# Patient Record
Sex: Female | Born: 1995 | Race: Black or African American | Hispanic: No | Smoking: Current every day smoker
Health system: Southern US, Community
[De-identification: ages and names within clinical notes are randomized; demographics above are authoritative.]

## PROBLEM LIST (undated history)

## (undated) ENCOUNTER — Inpatient Hospital Stay (HOSPITAL_COMMUNITY): Payer: Self-pay

## (undated) DIAGNOSIS — O1490 Unspecified pre-eclampsia, unspecified trimester: Secondary | ICD-10-CM

## (undated) DIAGNOSIS — G510 Bell's palsy: Secondary | ICD-10-CM

## (undated) DIAGNOSIS — B009 Herpesviral infection, unspecified: Secondary | ICD-10-CM

## (undated) HISTORY — DX: Unspecified pre-eclampsia, unspecified trimester: O14.90

## (undated) HISTORY — PX: COSMETIC SURGERY: SHX468

## (undated) HISTORY — PX: INDUCED ABORTION: SHX677

## (undated) HISTORY — DX: Herpesviral infection, unspecified: B00.9

---
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
## 2013-04-02 ENCOUNTER — Encounter (HOSPITAL_COMMUNITY): Payer: Self-pay | Admitting: *Deleted

## 2013-04-02 ENCOUNTER — Emergency Department (HOSPITAL_COMMUNITY)
Admission: EM | Admit: 2013-04-02 | Discharge: 2013-04-02 | Disposition: A | Discharge status: Home / Self Care | Payer: Medicaid Other | Attending: Emergency Medicine | Admitting: Emergency Medicine

## 2013-04-02 ENCOUNTER — Emergency Department (HOSPITAL_COMMUNITY): Payer: Medicaid Other

## 2013-04-02 DIAGNOSIS — O99330 Smoking (tobacco) complicating pregnancy, unspecified trimester: Secondary | ICD-10-CM | POA: Insufficient documentation

## 2013-04-02 DIAGNOSIS — O209 Hemorrhage in early pregnancy, unspecified: Secondary | ICD-10-CM | POA: Insufficient documentation

## 2013-04-02 DIAGNOSIS — O418X10 Other specified disorders of amniotic fluid and membranes, first trimester, not applicable or unspecified: Secondary | ICD-10-CM

## 2013-04-02 DIAGNOSIS — O43899 Other placental disorders, unspecified trimester: Secondary | ICD-10-CM | POA: Insufficient documentation

## 2013-04-02 DIAGNOSIS — R109 Unspecified abdominal pain: Secondary | ICD-10-CM | POA: Insufficient documentation

## 2013-04-02 DIAGNOSIS — R110 Nausea: Secondary | ICD-10-CM | POA: Insufficient documentation

## 2013-04-02 DIAGNOSIS — O9989 Other specified diseases and conditions complicating pregnancy, childbirth and the puerperium: Secondary | ICD-10-CM | POA: Insufficient documentation

## 2013-04-02 DIAGNOSIS — R350 Frequency of micturition: Secondary | ICD-10-CM | POA: Insufficient documentation

## 2013-04-02 DIAGNOSIS — O368990 Maternal care for other specified fetal problems, unspecified trimester, not applicable or unspecified: Secondary | ICD-10-CM | POA: Insufficient documentation

## 2013-04-02 LAB — CBC WITH DIFFERENTIAL/PLATELET
Basophils Absolute: 0 10*3/uL (ref 0.0–0.1)
Basophils Relative: 1 % (ref 0–1)
Eosinophils Absolute: 0 10*3/uL (ref 0.0–0.7)
Hemoglobin: 13.5 g/dL (ref 12.0–15.0)
MCH: 28.5 pg (ref 26.0–34.0)
MCHC: 34 g/dL (ref 30.0–36.0)
Monocytes Relative: 8 % (ref 3–12)
Neutrophils Relative %: 59 % (ref 43–77)
RDW: 13 % (ref 11.5–15.5)

## 2013-04-02 LAB — WET PREP, GENITAL
Clue Cells Wet Prep HPF POC: NONE SEEN
Trich, Wet Prep: NONE SEEN
WBC, Wet Prep HPF POC: NONE SEEN
Yeast Wet Prep HPF POC: NONE SEEN

## 2013-04-02 LAB — URINALYSIS, ROUTINE W REFLEX MICROSCOPIC
Bilirubin Urine: NEGATIVE
Glucose, UA: NEGATIVE mg/dL
Ketones, ur: 40 mg/dL — AB
Nitrite: NEGATIVE
Specific Gravity, Urine: >1.03 — ABNORMAL HIGH (ref 1.005–1.030)
pH: 6 (ref 5.0–8.0)

## 2013-04-02 LAB — HCG, QUANTITATIVE, PREGNANCY: hCG, Beta Chain, Quant, S: 9100 m[IU]/mL — ABNORMAL HIGH (ref <5)

## 2013-04-02 LAB — URINE MICROSCOPIC-ADD ON

## 2013-04-02 MED ORDER — ONDANSETRON HCL 4 MG PO TABS: 4.0000 mg | ORAL_TABLET | Freq: Four times a day (QID) | ORAL | Status: DC

## 2013-04-02 NOTE — ED Notes (Signed)
Vaginal bleeding, 5-[redacted] weeks pregnant.  Bleeding started  11 am.  "a little " cramping. With intermittent "sharp pains"

## 2013-04-02 NOTE — ED Provider Notes (Signed)
History     CSN: [id number]  Arrival date & time 04/02/13  1736   First MD Initiated Contact with Patient 04/02/13 1849      Chief Complaint  Patient presents with  . Vaginal Bleeding    (Consider location/radiation/quality/duration/timing/severity/associated sxs/prior treatment) Patient is a 17 y.o. female presenting with vaginal bleeding. The history is provided by the patient.  Vaginal Bleeding Quality:  Bright red and lighter than menses (sees blood on tissue after using toilet) Severity:  Moderate Onset quality:  Gradual Duration:  8 hours Timing:  Intermittent Progression:  Worsening Chronicity:  New Possible pregnancy: yes   Relieved by:  Nothing Associated symptoms: nausea   Associated symptoms: no back pain, no dysuria, no fatigue and no fever  Abdominal pain: cramping.    [place] is a 17 y.o. female G2 P 0, 0, 1, 0 who presents to the ED with vaginal bleeding. She states that she is maybe 5 to [redacted] weeks gestation. The bleeding started today approximately 11 am. She is having some lower abdominal cramping. LMP 02/11/2013. Gestational age [redacted] weeks 2 days by that LMP. [facility]. No taking anything for the cramping. Last pap smear one year ago and was normal. Hx of GC about a year ago. Current sex partner x 4 years. Never been on birth control.  History reviewed. No pertinent past medical history.  Past Surgical History  Procedure Laterality Date  . Induced abortion      History reviewed. No pertinent family history.  History  Substance Use Topics  . Smoking status: Current Every Day Smoker    Types: Cigarettes  . Smokeless tobacco: Not on file  . Alcohol Use: Yes     Comment: sometimes    OB History   Grav Para Term Preterm Abortions TAB SAB Ect Mult Living                  Review of Systems  Constitutional: Negative for fever, chills, appetite change and fatigue.  HENT: Negative for congestion,  sneezing and neck pain.   Eyes: Negative for pain and visual disturbance.  Respiratory: Negative for cough, chest tightness, shortness of breath and wheezing.   Gastrointestinal: Positive for nausea. Negative for vomiting. Abdominal pain: cramping.  Genitourinary: Positive for frequency and vaginal bleeding. Negative for dysuria and menstrual problem.  Musculoskeletal: Negative for back pain.  Skin: Negative for rash.  Neurological: Negative for light-headedness and headaches.  Psychiatric/Behavioral: The patient is not nervous/anxious.     Allergies  Review of patient's allergies indicates no known allergies.  Home Medications  No current outpatient prescriptions on file.  BP 104/88  Pulse 72  Temp(Src) 97.6 F (36.4 C) (Oral)  Resp 16  Ht 5\' 5"  (1.651 m)  Wt 115 lb (52.164 kg)  BMI 19.14 kg/m2  SpO2 98%  LMP 02/02/2013  Physical Exam  Nursing note and vitals reviewed. Constitutional: She is oriented to person, place, and time. She appears well-developed and well-nourished. No distress.  HENT:  Head: Normocephalic and atraumatic.  Eyes: EOM are normal.  Neck: Neck supple.  Cardiovascular: Normal rate.   Pulmonary/Chest: Effort normal.  Abdominal: Soft. There is no tenderness.  Unable to reproduce the cramping pain the patient has had.  Genitourinary:  External genitalia without lesions. Scant dark blood vaginal vault. No CMT, no adnexal tenderness. Uterus slightly enlarged.  Musculoskeletal: Normal range of motion.  Neurological: She is alert and oriented to person, place, and time. No  cranial nerve deficit.  Skin: Skin is warm and dry.  Psychiatric: She has a normal mood and affect. Her behavior is normal. Judgment and thought content normal.    ED Course  Procedures (including critical care time) Results for orders placed during the hospital encounter of 04/02/13 (from the past 24 hour(s))  URINALYSIS, ROUTINE W REFLEX MICROSCOPIC     Status: Abnormal    Collection Time    04/02/13  6:55 PM      Result Value Range   Color, Urine YELLOW  YELLOW   APPearance CLEAR  CLEAR   Specific Gravity, Urine >1.030 (*) 1.005 - 1.030   pH 6.0  5.0 - 8.0   Glucose, UA NEGATIVE  NEGATIVE mg/dL   Hgb urine dipstick LARGE (*) NEGATIVE   Bilirubin Urine NEGATIVE  NEGATIVE   Ketones, ur 40 (*) NEGATIVE mg/dL   Protein, ur TRACE (*) NEGATIVE mg/dL   Urobilinogen, UA 0.2  0.0 - 1.0 mg/dL   Nitrite NEGATIVE  NEGATIVE   Leukocytes, UA NEGATIVE  NEGATIVE  URINE MICROSCOPIC-ADD ON     Status: Abnormal   Collection Time    04/02/13  6:55 PM      Result Value Range   Squamous Epithelial / LPF FEW (*) RARE   WBC, UA 0-2  <3 WBC/hpf   RBC / HPF 11-20  <3 RBC/hpf  POCT PREGNANCY, URINE     Status: Abnormal   Collection Time    04/02/13  6:57 PM      Result Value Range   Preg Test, Ur POSITIVE (*) NEGATIVE  HCG, QUANTITATIVE, PREGNANCY     Status: Abnormal   Collection Time    04/02/13  6:59 PM      Result Value Range   hCG, Beta Chain, Quant, S 9100 (*) <5 mIU/mL  CBC WITH DIFFERENTIAL     Status: None   Collection Time    04/02/13  6:59 PM      Result Value Range   WBC 5.9  4.0 - 10.5 K/uL   RBC 4.74  3.87 - 5.11 MIL/uL   Hemoglobin 13.5  12.0 - 15.0 g/dL   HCT 96.0  45.4 - 09.8 %   MCV 83.8  78.0 - 100.0 fL   MCH 28.5  26.0 - 34.0 pg   MCHC 34.0  30.0 - 36.0 g/dL   RDW 11.9  14.7 - 82.9 %   Platelets 185  150 - 400 K/uL   Neutrophils Relative % 59  43 - 77 %   Neutro Abs 3.4  1.7 - 7.7 K/uL   Lymphocytes Relative 32  12 - 46 %   Lymphs Abs 1.9  0.7 - 4.0 K/uL   Monocytes Relative 8  3 - 12 %   Monocytes Absolute 0.5  0.1 - 1.0 K/uL   Eosinophils Relative 1  0 - 5 %   Eosinophils Absolute 0.0  0.0 - 0.7 K/uL   Basophils Relative 1  0 - 1 %   Basophils Absolute 0.0  0.0 - 0.1 K/uL  WET PREP, GENITAL     Status: None   Collection Time    04/02/13  7:31 PM      Result Value Range   Yeast Wet Prep HPF POC NONE SEEN  NONE SEEN   Trich, Wet  Prep NONE SEEN  NONE SEEN   Clue Cells Wet Prep HPF POC NONE SEEN  NONE SEEN   WBC, Wet Prep HPF POC NONE SEEN  NONE SEEN  Assessment: 17 y.o. female with vaginal bleeding and cramping in early pregnancy.  Diff Dx: Early IUP   SAB   Ectopic pregnancy  Plan:  Will obtain Ultrasound to assess pregnancy  US Ob Comp Less 14 Wks  04/02/2013   *RADIOLOGY REPORT*  Clinical Data: bleeding, pain,bleeding and pain; ;  OBSTETRIC <14 WK [place] AND TRANSVAGINAL OB US  Technique: Both transabdominal and transvaginal ultrasound examinations were performed for complete evaluation of the gestation as well as the maternal uterus, adnexal regions, and pelvic cul-de-sac.  Comparison: None.  Findings: There is a single intrauterine gestation.  Based on crown- rump length, estimated gestational age is 6 weeks 0-day.  While visually there appears to be a cardiac movement, a heart rate could not be obtained.  Recommend follow-up.  Small to moderate subchorionic hemorrhage.  Ovaries are symmetric in size.  Right corpus luteal cyst.  No free fluid.  IMPRESSION: 6-week-0-day intrauterine pregnancy.  Fetal heart rate not measurable at this time.  Recommend follow-up.  Small to moderate subchorionic hemorrhage.   Original Report Authenticated By: Charlett Nose, M.D.   US Ob Transvaginal  04/02/2013   *RADIOLOGY REPORT*  Clinical Data: bleeding, pain,bleeding and pain; ;  OBSTETRIC <14 WK [place] AND TRANSVAGINAL OB US  Technique: Both transabdominal and transvaginal ultrasound examinations were performed for complete evaluation of the gestation as well as the maternal uterus, adnexal regions, and pelvic cul-de-sac.  Comparison: None.  Findings: There is a single intrauterine gestation.  Based on crown- rump length, estimated gestational age is 6 weeks 0-day.  While visually there appears to be a cardiac movement, a heart rate could not be obtained.  Recommend follow-up.  Small to moderate subchorionic hemorrhage.  Ovaries are symmetric  in size.  Right corpus luteal cyst.  No free fluid.  IMPRESSION: 6-week-0-day intrauterine pregnancy.  Fetal heart rate not measurable at this time.  Recommend follow-up.  Small to moderate subchorionic hemorrhage.   Original Report Authenticated By: Charlett Nose, M.D.     MDM  17 y.o. female with threatened AB @ [redacted] weeks gestation. I discussed with the patient that the pregnancy measures 6 weeks by ultrasound but no heart beat is identified. I discussed signs and symptoms of miscarriage and need for follow up with OB. She voices understanding.  Will give Zofran for nausea.   Medication List    TAKE these medications       ondansetron 4 MG tablet  Commonly known as:  ZOFRAN  Take 1 tablet (4 mg total) by mouth every 6 (six) hours.              [place], [place] 04/02/13 2337

## 2013-04-05 ENCOUNTER — Other Ambulatory Visit: Payer: Self-pay | Admitting: Obstetrics & Gynecology

## 2013-04-05 ENCOUNTER — Ambulatory Visit (INDEPENDENT_AMBULATORY_CARE_PROVIDER_SITE_OTHER): Payer: Medicaid Other

## 2013-04-05 DIAGNOSIS — O021 Missed abortion: Secondary | ICD-10-CM

## 2013-04-05 DIAGNOSIS — O26849 Uterine size-date discrepancy, unspecified trimester: Secondary | ICD-10-CM

## 2013-04-05 DIAGNOSIS — O200 Threatened abortion: Secondary | ICD-10-CM

## 2013-04-05 LAB — GC/CHLAMYDIA PROBE AMP: GC Probe RNA: NEGATIVE

## 2013-04-05 NOTE — Progress Notes (Signed)
U/S-single IUP with +FCA noted, CRL c/w 6+1wks EDD 11/28/13, 12+mm sub-chorionic hemm noted adjacent to GS, cx long and closed, bilateral adnexa wnl

## 2013-04-07 NOTE — ED Provider Notes (Signed)
Medical screening examination/treatment/procedure(s) were performed by non-physician practitioner and as supervising physician I was immediately available for consultation/collaboration.   Shelda Jakes, MD 04/07/13 [phone]

## 2013-04-14 ENCOUNTER — Other Ambulatory Visit: Payer: Self-pay

## 2013-04-14 LAB — US OB COMP LESS 14 WKS

## 2013-04-20 ENCOUNTER — Encounter: Payer: Self-pay | Admitting: Women's Health

## 2013-04-20 ENCOUNTER — Ambulatory Visit (INDEPENDENT_AMBULATORY_CARE_PROVIDER_SITE_OTHER): Payer: Medicaid Other | Admitting: Women's Health

## 2013-04-20 VITALS — BP 118/64 | Wt 116.0 lb

## 2013-04-20 DIAGNOSIS — Z1389 Encounter for screening for other disorder: Secondary | ICD-10-CM

## 2013-04-20 DIAGNOSIS — Z3481 Encounter for supervision of other normal pregnancy, first trimester: Secondary | ICD-10-CM

## 2013-04-20 DIAGNOSIS — Z3480 Encounter for supervision of other normal pregnancy, unspecified trimester: Secondary | ICD-10-CM | POA: Insufficient documentation

## 2013-04-20 DIAGNOSIS — O09299 Supervision of pregnancy with other poor reproductive or obstetric history, unspecified trimester: Secondary | ICD-10-CM

## 2013-04-20 DIAGNOSIS — Z331 Pregnant state, incidental: Secondary | ICD-10-CM

## 2013-04-20 LAB — POCT URINALYSIS DIPSTICK
Glucose, UA: NEGATIVE
Ketones, UA: NEGATIVE
Leukocytes, UA: NEGATIVE
Nitrite, UA: NEGATIVE

## 2013-04-20 LAB — CBC
Hemoglobin: 11.8 g/dL — ABNORMAL LOW (ref 12.0–15.0)
MCV: 82.5 fL (ref 78.0–100.0)
Platelets: 194 10*3/uL (ref 150–400)
RBC: 4.4 MIL/uL (ref 3.87–5.11)
WBC: 4.9 10*3/uL (ref 4.0–10.5)

## 2013-04-20 NOTE — Patient Instructions (Addendum)
Pregnancy - First Trimester  During sexual intercourse, millions of sperm go into the vagina. Only 1 sperm will penetrate and fertilize the female egg while it is in the Fallopian tube. One week later, the fertilized egg implants into the wall of the uterus. An embryo begins to develop into a baby. At 6 to 8 weeks, the eyes and face are formed and the heartbeat can be seen on ultrasound. At the end of 12 weeks (first trimester), all the baby's organs are formed. Now that you are pregnant, you will want to do everything you can to have a healthy baby. Two of the most important things are to get good prenatal care and follow your caregiver's instructions. Prenatal care is all the medical care you receive before the baby's birth. It is given to prevent, find, and treat problems during the pregnancy and childbirth.  PRENATAL EXAMS  · During prenatal visits, your weight, blood pressure, and urine are checked. This is done to make sure you are healthy and progressing normally during the pregnancy.  · A pregnant woman should gain 25 to 35 pounds during the pregnancy. However, if you are overweight or underweight, your caregiver will advise you regarding your weight.  · Your caregiver will ask and answer questions for you.  · Blood work, cervical cultures, other necessary tests, and a Pap test are done during your prenatal exams. These tests are done to check on your health and the probable health of your baby. Tests are strongly recommended and done for HIV with your permission. This is the virus that causes AIDS. These tests are done because medicines can be given to help prevent your baby from being born with this infection should you have been infected without knowing it. Blood work is also used to find out your blood type, previous infections, and follow your blood levels (hemoglobin).  · Low hemoglobin (anemia) is common during pregnancy. Iron and vitamins are given to help prevent this. Later in the pregnancy, blood  tests for diabetes will be done along with any other tests if any problems develop.  · You may need other tests to make sure you and the baby are doing well.  CHANGES DURING THE FIRST TRIMESTER   Your body goes through many changes during pregnancy. They vary from person to person. Talk to your caregiver about changes you notice and are concerned about. Changes can include:  · Your menstrual period stops.  · The egg and sperm carry the genes that determine what you look like. Genes from you and your partner are forming a baby. The female genes determine whether the baby is a boy or a girl.  · Your body increases in girth and you may feel bloated.  · Feeling sick to your stomach (nauseous) and throwing up (vomiting). If the vomiting is uncontrollable, call your caregiver.  · Your breasts will begin to enlarge and become tender.  · Your nipples may stick out more and become darker.  · The need to urinate more. Painful urination may mean you have a bladder infection.  · Tiring easily.  · Loss of appetite.  · Cravings for certain kinds of food.  · At first, you may gain or lose a couple of pounds.  · You may have changes in your emotions from day to day (excited to be pregnant or concerned something may go wrong with the pregnancy and baby).  · You may have more vivid and strange dreams.  HOME CARE INSTRUCTIONS   ·   It is very important to avoid all smoking, alcohol and non-prescribed drugs during your pregnancy. These affect the formation and growth of the baby. Avoid chemicals while pregnant to ensure the delivery of a healthy infant.  · Start your prenatal visits by the 12th week of pregnancy. They are usually scheduled monthly at first, then more often in the last 2 months before delivery. Keep your caregiver's appointments. Follow your caregiver's instructions regarding medicine use, blood and lab tests, exercise, and diet.  · During pregnancy, you are providing food for you and your baby. Eat regular, well-balanced  meals. Choose foods such as meat, fish, milk and other low fat dairy products, vegetables, fruits, and whole-grain breads and cereals. Your caregiver will tell you of the ideal weight gain.  · You can help morning sickness by keeping soda crackers at the bedside. Eat a couple before arising in the morning. You may want to use the crackers without salt on them.  · Eating 4 to 5 small meals rather than 3 large meals a day also may help the nausea and vomiting.  · Drinking liquids between meals instead of during meals also seems to help nausea and vomiting.  · A physical sexual relationship may be continued throughout pregnancy if there are no other problems. Problems may be early (premature) leaking of amniotic fluid from the membranes, vaginal bleeding, or belly (abdominal) pain.  · Exercise regularly if there are no restrictions. Check with your caregiver or physical therapist if you are unsure of the safety of some of your exercises. Greater weight gain will occur in the last 2 trimesters of pregnancy. Exercising will help:  · Control your weight.  · Keep you in shape.  · Prepare you for labor and delivery.  · Help you lose your pregnancy weight after you deliver your baby.  · Wear a good support or jogging bra for breast tenderness during pregnancy. This may help if worn during sleep too.  · Ask when prenatal classes are available. Begin classes when they are offered.  · Do not use hot tubs, steam rooms, or saunas.  · Wear your seat belt when driving. This protects you and your baby if you are in an accident.  · Avoid raw meat, uncooked cheese, cat litter boxes, and soil used by cats throughout the pregnancy. These carry germs that can cause birth defects in the baby.  · The first trimester is a good time to visit your dentist for your dental health. Getting your teeth cleaned is okay. Use a softer toothbrush and brush gently during pregnancy.  · Ask for help if you have financial, counseling, or nutritional needs  during pregnancy. Your caregiver will be able to offer counseling for these needs as well as refer you for other special needs.  · Do not take any medicines or herbs unless told by your caregiver.  · Inform your caregiver if there is any mental or physical domestic violence.  · Make a list of emergency phone numbers of family, friends, hospital, and police and fire departments.  · Write down your questions. Take them to your prenatal visit.  · Do not douche.  · Do not cross your legs.  · If you have to stand for long periods of time, rotate you feet or take small steps in a circle.  · You may have more vaginal secretions that may require a sanitary pad. Do not use tampons or scented sanitary pads.  MEDICINES AND DRUG USE IN PREGNANCY  ·   Take prenatal vitamins as directed. The vitamin should contain 1 milligram of folic acid. Keep all vitamins out of reach of children. Only a couple vitamins or tablets containing iron may be fatal to a baby or young child when ingested.  · Avoid use of all medicines, including herbs, over-the-counter medicines, not prescribed or suggested by your caregiver. Only take over-the-counter or prescription medicines for pain, discomfort, or fever as directed by your caregiver. Do not use aspirin, ibuprofen, or naproxen unless directed by your caregiver.  · Let your caregiver also know about herbs you may be using.  · Alcohol is related to a number of birth defects. This includes fetal alcohol syndrome. All alcohol, in any form, should be avoided completely. Smoking will cause low birth rate and premature babies.  · Street or illegal drugs are very harmful to the baby. They are absolutely forbidden. A baby born to an addicted mother will be addicted at birth. The baby will go through the same withdrawal an adult does.  · Let your caregiver know about any medicines that you have to take and for what reason you take them.  SEEK MEDICAL CARE IF:   You have any concerns or worries during your  pregnancy. It is better to call with your questions if you feel they cannot wait, rather than worry about them.  SEEK IMMEDIATE MEDICAL CARE IF:   · An unexplained oral temperature above 102° F (38.9° C) develops, or as your caregiver suggests.  · You have leaking of fluid from the vagina (birth canal). If leaking membranes are suspected, take your temperature and inform your caregiver of this when you call.  · There is vaginal spotting or bleeding. Notify your caregiver of the amount and how many pads are used.  · You develop a bad smelling vaginal discharge with a change in the color.  · You continue to feel sick to your stomach (nauseated) and have no relief from remedies suggested. You vomit blood or coffee ground-like materials.  · You lose more than 2 pounds of weight in 1 week.  · You gain more than 2 pounds of weight in 1 week and you notice swelling of your face, hands, feet, or legs.  · You gain 5 pounds or more in 1 week (even if you do not have swelling of your hands, face, legs, or feet).  · You get exposed to German measles and have never had them.  · You are exposed to fifth disease or chickenpox.  · You develop belly (abdominal) pain. Round ligament discomfort is a common non-cancerous (benign) cause of abdominal pain in pregnancy. Your caregiver still must evaluate this.  · You develop headache, fever, diarrhea, pain with urination, or shortness of breath.  · You fall or are in a car accident or have any kind of trauma.  · There is mental or physical violence in your home.  Document Released: 10/15/2001 Document Revised: 07/15/2012 Document Reviewed: 04/18/2009  [employer]® Patient Information ©2014 [employer].

## 2013-04-20 NOTE — Progress Notes (Signed)
  Subjective:    Virginia Mills is a 17 y.o. G2P0010 African American female at [redacted]w[redacted]d by 6wk u/s, being seen today for her first obstetrical visit.  Her obstetrical history is significant for teen pregnancy and h/o EAB.  Pregnancy history fully reviewed.  Patient reports dark brown, sometimes red, spotting w/ wiping for few weeks. Has been having sexual intercourse. 6wk u/s showed 12mm SCH adjacent to GS.  Denies nausea, vomiting, urinary frequency, hesitancy, dysuria, or urgency.   Filed Vitals:   04/20/13 1118  BP: 118/64  Weight: 116 lb (52.617 kg)    HISTORY: OB History   Grav Para Term Preterm Abortions TAB SAB Ect Mult Living   2    1 1          # Outc Date GA Lbr Len/2nd Wgt Sex Del Anes PTL Lv   1 TAB 2009           2 CUR              Past Medical History  Diagnosis Date  . Medical history non-contributory    Past Surgical History  Procedure Laterality Date  . Induced abortion     History reviewed. No pertinent family history.  System:     Skin: normal coloration and turgor, no rashes    Neurologic: oriented, normal mood   Extremities: normal strength, tone, and muscle mass   HEENT PERRLA   Mouth/Teeth mucous membranes moist   Cardiovascular: regular rate and rhythm   Respiratory:  appears well, vitals normal, no respiratory distress, acyanotic, normal RR   Abdomen: soft, non-tender    Thin prep pap smear not indicated d/t age  FHR 160 via informal transabdominal u/s   Assessment:    Pregnancy: G2P0010 There are no active problems to display for this patient.     [redacted]w[redacted]d G2P0010 New OB visit [facility] w/ VB   Plan:     Initial labs drawn Continue prenatal vitamins Problem list reviewed and updated Reviewed n/v relief measures and warning s/s to report Reviewed recommended weight gain based on pre-gravid BMI Encouraged well-balanced diet Pelvic rest until at least 1wk w/o bleeding Genetic Screening discussed Integrated Screen: requested Cystic  fibrosis screening discussed declined Ultrasound discussed; fetal survey: requested Follow up in 4 weeks for 1st NT/IT and visit [facility] completed and faxed  [name] 04/20/2013 11:50 AM

## 2013-04-20 NOTE — Progress Notes (Signed)
Spotting off and on. New OB packet given. Consents signed.

## 2013-04-21 LAB — ANTIBODY SCREEN: Antibody Screen: NEGATIVE

## 2013-04-21 LAB — URINALYSIS
Leukocytes, UA: NEGATIVE
Nitrite: NEGATIVE
Specific Gravity, Urine: 1.024 (ref 1.005–1.030)
Urobilinogen, UA: 0.2 mg/dL (ref 0.0–1.0)
pH: 7.5 (ref 5.0–8.0)

## 2013-04-21 LAB — DRUG SCREEN, URINE, NO CONFIRMATION
Amphetamine Screen, Ur: NEGATIVE
Benzodiazepines.: NEGATIVE
Methadone: NEGATIVE
Phencyclidine (PCP): NEGATIVE
Propoxyphene: NEGATIVE

## 2013-04-21 LAB — GC/CHLAMYDIA PROBE AMP: GC Probe RNA: NEGATIVE

## 2013-04-21 LAB — OXYCODONE SCREEN, UA, RFLX CONFIRM: Oxycodone Screen, Ur: NEGATIVE ng/mL

## 2013-04-21 LAB — HIV ANTIBODY (ROUTINE TESTING W REFLEX): HIV: NONREACTIVE

## 2013-04-22 LAB — VARICELLA ZOSTER ANTIBODY, IGG: Varicella IgG: 205.8 Index — ABNORMAL HIGH (ref <135.00)

## 2013-04-22 LAB — URINE CULTURE: Colony Count: >=100000

## 2013-04-24 ENCOUNTER — Encounter: Payer: Self-pay | Admitting: Women's Health

## 2013-04-24 DIAGNOSIS — F1290 Cannabis use, unspecified, uncomplicated: Secondary | ICD-10-CM | POA: Insufficient documentation

## 2013-05-04 ENCOUNTER — Telehealth: Payer: Self-pay | Admitting: Women's Health

## 2013-05-05 ENCOUNTER — Telehealth: Payer: Self-pay | Admitting: Women's Health

## 2013-05-05 NOTE — Telephone Encounter (Signed)
Left message. JSY 

## 2013-05-05 NOTE — Telephone Encounter (Signed)
[place] left message for pt °

## 2013-05-12 ENCOUNTER — Emergency Department (HOSPITAL_COMMUNITY): Admission: EM | Admit: 2013-05-12 | Discharge: 2013-05-12 | Discharge status: Against Medical Advice | Payer: Self-pay

## 2013-05-12 NOTE — Telephone Encounter (Signed)
Spoke with pt. Pt said on My Chart it said her baby's heart rate was 119. I pulled up that visit and it's documented that FHR was 160. Advised that is a good heart rate for the baby. Pt voiced understanding and had no further questions. JSY

## 2013-05-13 ENCOUNTER — Other Ambulatory Visit: Payer: Self-pay | Admitting: Women's Health

## 2013-05-13 DIAGNOSIS — Z3481 Encounter for supervision of other normal pregnancy, first trimester: Secondary | ICD-10-CM

## 2013-05-18 ENCOUNTER — Ambulatory Visit (INDEPENDENT_AMBULATORY_CARE_PROVIDER_SITE_OTHER): Payer: Medicaid Other

## 2013-05-18 ENCOUNTER — Other Ambulatory Visit: Payer: Self-pay | Admitting: Women's Health

## 2013-05-18 ENCOUNTER — Encounter: Payer: Self-pay | Admitting: Women's Health

## 2013-05-18 ENCOUNTER — Ambulatory Visit (INDEPENDENT_AMBULATORY_CARE_PROVIDER_SITE_OTHER): Payer: Medicaid Other | Admitting: Women's Health

## 2013-05-18 VITALS — BP 120/82 | Wt 114.5 lb

## 2013-05-18 DIAGNOSIS — Z1389 Encounter for screening for other disorder: Secondary | ICD-10-CM

## 2013-05-18 DIAGNOSIS — F1290 Cannabis use, unspecified, uncomplicated: Secondary | ICD-10-CM

## 2013-05-18 DIAGNOSIS — O99019 Anemia complicating pregnancy, unspecified trimester: Secondary | ICD-10-CM

## 2013-05-18 DIAGNOSIS — Z3481 Encounter for supervision of other normal pregnancy, first trimester: Secondary | ICD-10-CM

## 2013-05-18 DIAGNOSIS — Z36 Encounter for antenatal screening of mother: Secondary | ICD-10-CM

## 2013-05-18 DIAGNOSIS — Z331 Pregnant state, incidental: Secondary | ICD-10-CM

## 2013-05-18 DIAGNOSIS — O09299 Supervision of pregnancy with other poor reproductive or obstetric history, unspecified trimester: Secondary | ICD-10-CM

## 2013-05-18 LAB — POCT URINALYSIS DIPSTICK
Leukocytes, UA: NEGATIVE
Nitrite, UA: NEGATIVE
Protein, UA: NEGATIVE

## 2013-05-18 NOTE — Progress Notes (Signed)
Denies cramping, lof, vb, urinary frequency, urgency, hesitancy, or dysuria.  No complaints.  1st IT/NT today. Reviewed u/s results, warning s/s to report.  All questions answered. F/U in 4wks for 2nd IT and visit.

## 2013-05-18 NOTE — Progress Notes (Signed)
NT screen complete, cx long and closed, bilat ovs seen, CRL=57 mm, NT= 1.3 mm, meas C/W dates, fluid nml, NB seen

## 2013-05-18 NOTE — Patient Instructions (Addendum)
Pregnancy - Second Trimester The second trimester of pregnancy (3 to 6 months) is a period of rapid growth for you and your baby. At the end of the sixth month, your baby is about 9 inches long and weighs 1 1/2 pounds. You will begin to feel the baby move between 18 and 20 weeks of the pregnancy. This is called quickening. Weight gain is faster. A clear fluid (colostrum) may leak out of your breasts. You may feel small contractions of the womb (uterus). This is known as false labor or Braxton-Hicks contractions. This is like a practice for labor when the baby is ready to be born. Usually, the problems with morning sickness have usually passed by the end of your first trimester. Some women develop small dark blotches (called cholasma, mask of pregnancy) on their face that usually goes away after the baby is born. Exposure to the sun makes the blotches worse. Acne may also develop in some pregnant women and pregnant women who have acne, may find that it goes away. PRENATAL EXAMS  Blood work may continue to be done during prenatal exams. These tests are done to check on your health and the probable health of your baby. Blood work is used to follow your blood levels (hemoglobin). Anemia (low hemoglobin) is common during pregnancy. Iron and vitamins are given to help prevent this. You will also be checked for diabetes between 24 and 28 weeks of the pregnancy. Some of the previous blood tests may be repeated.  The size of the uterus is measured during each visit. This is to make sure that the baby is continuing to grow properly according to the dates of the pregnancy.  Your blood pressure is checked every prenatal visit. This is to make sure you are not getting toxemia.  Your urine is checked to make sure you do not have an infection, diabetes or protein in the urine.  Your weight is checked often to make sure gains are happening at the suggested rate. This is to ensure that both you and your baby are  growing normally.  Sometimes, an ultrasound is performed to confirm the proper growth and development of the baby. This is a test which bounces harmless sound waves off the baby so your caregiver can more accurately determine due dates. Sometimes, a test is done on the amniotic fluid surrounding the baby. This test is called an amniocentesis. The amniotic fluid is obtained by sticking a needle into the belly (abdomen). This is done to check the chromosomes in instances where there is a concern about possible genetic problems with the baby. It is also sometimes done near the end of pregnancy if an early delivery is required. In this case, it is done to help make sure the baby's lungs are mature enough for the baby to live outside of the womb. CHANGES OCCURING IN THE SECOND TRIMESTER OF PREGNANCY Your body goes through many changes during pregnancy. They vary from person to person. Talk to your caregiver about changes you notice that you are concerned about.  During the second trimester, you will likely have an increase in your appetite. It is normal to have cravings for certain foods. This varies from person to person and pregnancy to pregnancy.  Your lower abdomen will begin to bulge.  You may have to urinate more often because the uterus and baby are pressing on your bladder. It is also common to get more bladder infections during pregnancy. You can help this by drinking lots of fluids   and emptying your bladder before and after intercourse.  You may begin to get stretch marks on your hips, abdomen, and breasts. These are normal changes in the body during pregnancy. There are no exercises or medicines to take that prevent this change.  You may begin to develop swollen and bulging veins (varicose veins) in your legs. Wearing support hose, elevating your feet for 15 minutes, 3 to 4 times a day and limiting salt in your diet helps lessen the problem.  Heartburn may develop as the uterus grows and  pushes up against the stomach. Antacids recommended by your caregiver helps with this problem. Also, eating smaller meals 4 to 5 times a day helps.  Constipation can be treated with a stool softener or adding bulk to your diet. Drinking lots of fluids, and eating vegetables, fruits, and whole grains are helpful.  Exercising is also helpful. If you have been very active up until your pregnancy, most of these activities can be continued during your pregnancy. If you have been less active, it is helpful to start an exercise program such as walking.  Hemorrhoids may develop at the end of the second trimester. Warm sitz baths and hemorrhoid cream recommended by your caregiver helps hemorrhoid problems.  Backaches may develop during this time of your pregnancy. Avoid heavy lifting, wear low heal shoes, and practice good posture to help with backache problems.  Some pregnant women develop tingling and numbness of their hand and fingers because of swelling and tightening of ligaments in the wrist (carpel tunnel syndrome). This goes away after the baby is born.  As your breasts enlarge, you may have to get a bigger bra. Get a comfortable, cotton, support bra. Do not get a nursing bra until the last month of the pregnancy if you will be nursing the baby.  You may get a dark line from your belly button to the pubic area called the linea nigra.  You may develop rosy cheeks because of increase blood flow to the face.  You may develop spider looking lines of the face, neck, arms, and chest. These go away after the baby is born. HOME CARE INSTRUCTIONS   It is extremely important to avoid all smoking, herbs, alcohol, and unprescribed drugs during your pregnancy. These chemicals affect the formation and growth of the baby. Avoid these chemicals throughout the pregnancy to ensure the delivery of a healthy infant.  Most of your home care instructions are the same as suggested for the first trimester of your  pregnancy. Keep your caregiver's appointments. Follow your caregiver's instructions regarding medicine use, exercise, and diet.  During pregnancy, you are providing food for you and your baby. Continue to eat regular, well-balanced meals. Choose foods such as meat, fish, milk and other low fat dairy products, vegetables, fruits, and whole-grain breads and cereals. Your caregiver will tell you of the ideal weight gain.  A physical sexual relationship may be continued up until near the end of pregnancy if there are no other problems. Problems could include early (premature) leaking of amniotic fluid from the membranes, vaginal bleeding, abdominal pain, or other medical or pregnancy problems.  Exercise regularly if there are no restrictions. Check with your caregiver if you are unsure of the safety of some of your exercises. The greatest weight gain will occur in the last 2 trimesters of pregnancy. Exercise will help you:  Control your weight.  Get you in shape for labor and delivery.  Lose weight after you have the baby.  Wear   a good support or jogging bra for breast tenderness during pregnancy. This may help if worn during sleep. Pads or tissues may be used in the bra if you are leaking colostrum.  Do not use hot tubs, steam rooms or saunas throughout the pregnancy.  Wear your seat belt at all times when driving. This protects you and your baby if you are in an accident.  Avoid raw meat, uncooked cheese, cat litter boxes, and soil used by cats. These carry germs that can cause birth defects in the baby.  The second trimester is also a good time to visit your dentist for your dental health if this has not been done yet. Getting your teeth cleaned is okay. Use a soft toothbrush. Brush gently during pregnancy.  It is easier to leak urine during pregnancy. Tightening up and strengthening the pelvic muscles will help with this problem. Practice stopping your urination while you are going to the  bathroom. These are the same muscles you need to strengthen. It is also the muscles you would use as if you were trying to stop from passing gas. You can practice tightening these muscles up 10 times a set and repeating this about 3 times per day. Once you know what muscles to tighten up, do not perform these exercises during urination. It is more likely to contribute to an infection by backing up the urine.  Ask for help if you have financial, counseling, or nutritional needs during pregnancy. Your caregiver will be able to offer counseling for these needs as well as refer you for other special needs.  Your skin may become oily. If so, wash your face with mild soap, use non-greasy moisturizer and oil or cream based makeup. MEDICINES AND DRUG USE IN PREGNANCY  Take prenatal vitamins as directed. The vitamin should contain 1 milligram of folic acid. Keep all vitamins out of reach of children. Only a couple vitamins or tablets containing iron may be fatal to a baby or young child when ingested.  Avoid use of all medicines, including herbs, over-the-counter medicines, not prescribed or suggested by your caregiver. Only take over-the-counter or prescription medicines for pain, discomfort, or fever as directed by your caregiver. Do not use aspirin.  Let your caregiver also know about herbs you may be using.  Alcohol is related to a number of birth defects. This includes fetal alcohol syndrome. All alcohol, in any form, should be avoided completely. Smoking will cause low birth rate and premature babies.  Street or illegal drugs are very harmful to the baby. They are absolutely forbidden. A baby born to an addicted mother will be addicted at birth. The baby will go through the same withdrawal an adult does. SEEK MEDICAL CARE IF:  You have any concerns or worries during your pregnancy. It is better to call with your questions if you feel they cannot wait, rather than worry about them. SEEK IMMEDIATE  MEDICAL CARE IF:   An unexplained oral temperature above 102 F (38.9 C) develops, or as your caregiver suggests.  You have leaking of fluid from the vagina (birth canal). If leaking membranes are suspected, take your temperature and tell your caregiver of this when you call.  There is vaginal spotting, bleeding, or passing clots. Tell your caregiver of the amount and how many pads are used. Light spotting in pregnancy is common, especially following intercourse.  You develop a bad smelling vaginal discharge with a change in the color from clear to white.  You continue to feel   sick to your stomach (nauseated) and have no relief from remedies suggested. You vomit blood or coffee ground-like materials.  You lose more than 2 pounds of weight or gain more than 2 pounds of weight over 1 week, or as suggested by your caregiver.  You notice swelling of your face, hands, feet, or legs.  You get exposed to German measles and have never had them.  You are exposed to fifth disease or chickenpox.  You develop belly (abdominal) pain. Round ligament discomfort is a common non-cancerous (benign) cause of abdominal pain in pregnancy. Your caregiver still must evaluate you.  You develop a bad headache that does not go away.  You develop fever, diarrhea, pain with urination, or shortness of breath.  You develop visual problems, blurry, or double vision.  You fall or are in a car accident or any kind of trauma.  There is mental or physical violence at home. Document Released: 10/15/2001 Document Revised: 07/15/2012 Document Reviewed: 04/19/2009 [employer] Patient Information 2014 [employer].  

## 2013-05-19 LAB — DRUG SCREEN, URINE, NO CONFIRMATION
Amphetamine Screen, Ur: NEGATIVE
Barbiturate Quant, Ur: NEGATIVE
Benzodiazepines.: NEGATIVE
Cocaine Metabolites: NEGATIVE
Creatinine,U: 272.1 mg/dL
Marijuana Metabolite: POSITIVE — AB
Methadone: NEGATIVE
Opiate Screen, Urine: NEGATIVE
Phencyclidine (PCP): NEGATIVE
Propoxyphene: NEGATIVE

## 2013-05-19 LAB — OXYCODONE SCREEN, UA, RFLX CONFIRM: Oxycodone Screen, Ur: NEGATIVE ng/mL

## 2013-05-20 ENCOUNTER — Encounter: Payer: Self-pay | Admitting: Women's Health

## 2013-06-14 ENCOUNTER — Telehealth: Payer: Self-pay | Admitting: Obstetrics & Gynecology

## 2013-06-14 NOTE — Telephone Encounter (Signed)
Left message x 1. JSY 

## 2013-06-15 ENCOUNTER — Encounter: Payer: Self-pay | Admitting: Women's Health

## 2013-06-15 ENCOUNTER — Ambulatory Visit (INDEPENDENT_AMBULATORY_CARE_PROVIDER_SITE_OTHER): Payer: Medicaid Other | Admitting: Women's Health

## 2013-06-15 ENCOUNTER — Other Ambulatory Visit: Payer: Self-pay | Admitting: Women's Health

## 2013-06-15 VITALS — BP 110/64 | Wt 122.5 lb

## 2013-06-15 DIAGNOSIS — Z331 Pregnant state, incidental: Secondary | ICD-10-CM

## 2013-06-15 DIAGNOSIS — O09299 Supervision of pregnancy with other poor reproductive or obstetric history, unspecified trimester: Secondary | ICD-10-CM

## 2013-06-15 DIAGNOSIS — Z3482 Encounter for supervision of other normal pregnancy, second trimester: Secondary | ICD-10-CM

## 2013-06-15 DIAGNOSIS — O99019 Anemia complicating pregnancy, unspecified trimester: Secondary | ICD-10-CM

## 2013-06-15 DIAGNOSIS — Z1389 Encounter for screening for other disorder: Secondary | ICD-10-CM

## 2013-06-15 LAB — POCT URINALYSIS DIPSTICK: Protein, UA: NEGATIVE

## 2013-06-15 NOTE — Telephone Encounter (Signed)
Pt had appt 06/15/13 with Kim,CNM. Pt having pain in left side and brought it up at appt. JSY

## 2013-06-15 NOTE — Patient Instructions (Signed)
Pregnancy - Second Trimester The second trimester of pregnancy (3 to 6 months) is a period of rapid growth for you and your baby. At the end of the sixth month, your baby is about 9 inches long and weighs 1 1/2 pounds. You will begin to feel the baby move between 18 and 20 weeks of the pregnancy. This is called quickening. Weight gain is faster. A clear fluid (colostrum) may leak out of your breasts. You may feel small contractions of the womb (uterus). This is known as false labor or Braxton-Hicks contractions. This is like a practice for labor when the baby is ready to be born. Usually, the problems with morning sickness have usually passed by the end of your first trimester. Some women develop small dark blotches (called cholasma, mask of pregnancy) on their face that usually goes away after the baby is born. Exposure to the sun makes the blotches worse. Acne may also develop in some pregnant women and pregnant women who have acne, may find that it goes away. PRENATAL EXAMS  Blood work may continue to be done during prenatal exams. These tests are done to check on your health and the probable health of your baby. Blood work is used to follow your blood levels (hemoglobin). Anemia (low hemoglobin) is common during pregnancy. Iron and vitamins are given to help prevent this. You will also be checked for diabetes between 24 and 28 weeks of the pregnancy. Some of the previous blood tests may be repeated.  The size of the uterus is measured during each visit. This is to make sure that the baby is continuing to grow properly according to the dates of the pregnancy.  Your blood pressure is checked every prenatal visit. This is to make sure you are not getting toxemia.  Your urine is checked to make sure you do not have an infection, diabetes or protein in the urine.  Your weight is checked often to make sure gains are happening at the suggested rate. This is to ensure that both you and your baby are  growing normally.  Sometimes, an ultrasound is performed to confirm the proper growth and development of the baby. This is a test which bounces harmless sound waves off the baby so your caregiver can more accurately determine due dates. Sometimes, a test is done on the amniotic fluid surrounding the baby. This test is called an amniocentesis. The amniotic fluid is obtained by sticking a needle into the belly (abdomen). This is done to check the chromosomes in instances where there is a concern about possible genetic problems with the baby. It is also sometimes done near the end of pregnancy if an early delivery is required. In this case, it is done to help make sure the baby's lungs are mature enough for the baby to live outside of the womb. CHANGES OCCURING IN THE SECOND TRIMESTER OF PREGNANCY Your body goes through many changes during pregnancy. They vary from person to person. Talk to your caregiver about changes you notice that you are concerned about.  During the second trimester, you will likely have an increase in your appetite. It is normal to have cravings for certain foods. This varies from person to person and pregnancy to pregnancy.  Your lower abdomen will begin to bulge.  You may have to urinate more often because the uterus and baby are pressing on your bladder. It is also common to get more bladder infections during pregnancy. You can help this by drinking lots of fluids   and emptying your bladder before and after intercourse.  You may begin to get stretch marks on your hips, abdomen, and breasts. These are normal changes in the body during pregnancy. There are no exercises or medicines to take that prevent this change.  You may begin to develop swollen and bulging veins (varicose veins) in your legs. Wearing support hose, elevating your feet for 15 minutes, 3 to 4 times a day and limiting salt in your diet helps lessen the problem.  Heartburn may develop as the uterus grows and  pushes up against the stomach. Antacids recommended by your caregiver helps with this problem. Also, eating smaller meals 4 to 5 times a day helps.  Constipation can be treated with a stool softener or adding bulk to your diet. Drinking lots of fluids, and eating vegetables, fruits, and whole grains are helpful.  Exercising is also helpful. If you have been very active up until your pregnancy, most of these activities can be continued during your pregnancy. If you have been less active, it is helpful to start an exercise program such as walking.  Hemorrhoids may develop at the end of the second trimester. Warm sitz baths and hemorrhoid cream recommended by your caregiver helps hemorrhoid problems.  Backaches may develop during this time of your pregnancy. Avoid heavy lifting, wear low heal shoes, and practice good posture to help with backache problems.  Some pregnant women develop tingling and numbness of their hand and fingers because of swelling and tightening of ligaments in the wrist (carpel tunnel syndrome). This goes away after the baby is born.  As your breasts enlarge, you may have to get a bigger bra. Get a comfortable, cotton, support bra. Do not get a nursing bra until the last month of the pregnancy if you will be nursing the baby.  You may get a dark line from your belly button to the pubic area called the linea nigra.  You may develop rosy cheeks because of increase blood flow to the face.  You may develop spider looking lines of the face, neck, arms, and chest. These go away after the baby is born. HOME CARE INSTRUCTIONS   It is extremely important to avoid all smoking, herbs, alcohol, and unprescribed drugs during your pregnancy. These chemicals affect the formation and growth of the baby. Avoid these chemicals throughout the pregnancy to ensure the delivery of a healthy infant.  Most of your home care instructions are the same as suggested for the first trimester of your  pregnancy. Keep your caregiver's appointments. Follow your caregiver's instructions regarding medicine use, exercise, and diet.  During pregnancy, you are providing food for you and your baby. Continue to eat regular, well-balanced meals. Choose foods such as meat, fish, milk and other low fat dairy products, vegetables, fruits, and whole-grain breads and cereals. Your caregiver will tell you of the ideal weight gain.  A physical sexual relationship may be continued up until near the end of pregnancy if there are no other problems. Problems could include early (premature) leaking of amniotic fluid from the membranes, vaginal bleeding, abdominal pain, or other medical or pregnancy problems.  Exercise regularly if there are no restrictions. Check with your caregiver if you are unsure of the safety of some of your exercises. The greatest weight gain will occur in the last 2 trimesters of pregnancy. Exercise will help you:  Control your weight.  Get you in shape for labor and delivery.  Lose weight after you have the baby.  Wear   a good support or jogging bra for breast tenderness during pregnancy. This may help if worn during sleep. Pads or tissues may be used in the bra if you are leaking colostrum.  Do not use hot tubs, steam rooms or saunas throughout the pregnancy.  Wear your seat belt at all times when driving. This protects you and your baby if you are in an accident.  Avoid raw meat, uncooked cheese, cat litter boxes, and soil used by cats. These carry germs that can cause birth defects in the baby.  The second trimester is also a good time to visit your dentist for your dental health if this has not been done yet. Getting your teeth cleaned is okay. Use a soft toothbrush. Brush gently during pregnancy.  It is easier to leak urine during pregnancy. Tightening up and strengthening the pelvic muscles will help with this problem. Practice stopping your urination while you are going to the  bathroom. These are the same muscles you need to strengthen. It is also the muscles you would use as if you were trying to stop from passing gas. You can practice tightening these muscles up 10 times a set and repeating this about 3 times per day. Once you know what muscles to tighten up, do not perform these exercises during urination. It is more likely to contribute to an infection by backing up the urine.  Ask for help if you have financial, counseling, or nutritional needs during pregnancy. Your caregiver will be able to offer counseling for these needs as well as refer you for other special needs.  Your skin may become oily. If so, wash your face with mild soap, use non-greasy moisturizer and oil or cream based makeup. MEDICINES AND DRUG USE IN PREGNANCY  Take prenatal vitamins as directed. The vitamin should contain 1 milligram of folic acid. Keep all vitamins out of reach of children. Only a couple vitamins or tablets containing iron may be fatal to a baby or young child when ingested.  Avoid use of all medicines, including herbs, over-the-counter medicines, not prescribed or suggested by your caregiver. Only take over-the-counter or prescription medicines for pain, discomfort, or fever as directed by your caregiver. Do not use aspirin.  Let your caregiver also know about herbs you may be using.  Alcohol is related to a number of birth defects. This includes fetal alcohol syndrome. All alcohol, in any form, should be avoided completely. Smoking will cause low birth rate and premature babies.  Street or illegal drugs are very harmful to the baby. They are absolutely forbidden. A baby born to an addicted mother will be addicted at birth. The baby will go through the same withdrawal an adult does. SEEK MEDICAL CARE IF:  You have any concerns or worries during your pregnancy. It is better to call with your questions if you feel they cannot wait, rather than worry about them. SEEK IMMEDIATE  MEDICAL CARE IF:   An unexplained oral temperature above 102 F (38.9 C) develops, or as your caregiver suggests.  You have leaking of fluid from the vagina (birth canal). If leaking membranes are suspected, take your temperature and tell your caregiver of this when you call.  There is vaginal spotting, bleeding, or passing clots. Tell your caregiver of the amount and how many pads are used. Light spotting in pregnancy is common, especially following intercourse.  You develop a bad smelling vaginal discharge with a change in the color from clear to white.  You continue to feel   sick to your stomach (nauseated) and have no relief from remedies suggested. You vomit blood or coffee ground-like materials.  You lose more than 2 pounds of weight or gain more than 2 pounds of weight over 1 week, or as suggested by your caregiver.  You notice swelling of your face, hands, feet, or legs.  You get exposed to German measles and have never had them.  You are exposed to fifth disease or chickenpox.  You develop belly (abdominal) pain. Round ligament discomfort is a common non-cancerous (benign) cause of abdominal pain in pregnancy. Your caregiver still must evaluate you.  You develop a bad headache that does not go away.  You develop fever, diarrhea, pain with urination, or shortness of breath.  You develop visual problems, blurry, or double vision.  You fall or are in a car accident or any kind of trauma.  There is mental or physical violence at home. Document Released: 10/15/2001 Document Revised: 07/15/2012 Document Reviewed: 04/19/2009 [employer] Patient Information 2014 [employer].  

## 2013-06-15 NOTE — Progress Notes (Signed)
Reports good fm. Denies uc's, lof, vb, urinary frequency, urgency, hesitancy, or dysuria.  No complaints.  Reviewed warning s/s.  All questions answered. 2nd IT today. F/U in 4wks for anatomy u/s and visit.

## 2013-06-15 NOTE — Progress Notes (Signed)
Pain in left side.

## 2013-06-18 LAB — MATERNAL SCREEN, INTEGRATED #2
AFP MoM: 1.17
AFP, Serum: 54.5 ng/mL
Crown Rump Length: 57 mm
Estriol, Free: 0.88 ng/mL
MSS Down Syndrome: 1:3700 {titer}
Nuchal Translucency: 1.34 mm
Number of fetuses: 1
PAPP-A: 818 ng/mL
hCG MoM: 2.11
hCG, Serum: 94.5 IU/mL

## 2013-06-19 ENCOUNTER — Encounter: Payer: Self-pay | Admitting: Women's Health

## 2013-06-30 ENCOUNTER — Encounter: Payer: Self-pay | Admitting: Women's Health

## 2013-06-30 ENCOUNTER — Ambulatory Visit (INDEPENDENT_AMBULATORY_CARE_PROVIDER_SITE_OTHER): Payer: Medicaid Other | Admitting: Adult Health

## 2013-06-30 ENCOUNTER — Telehealth: Payer: Self-pay | Admitting: Obstetrics and Gynecology

## 2013-06-30 ENCOUNTER — Encounter: Payer: Self-pay | Admitting: Adult Health

## 2013-06-30 VITALS — BP 110/60 | Temp 98.3°F | Wt 122.0 lb

## 2013-06-30 DIAGNOSIS — Z331 Pregnant state, incidental: Secondary | ICD-10-CM

## 2013-06-30 DIAGNOSIS — J069 Acute upper respiratory infection, unspecified: Secondary | ICD-10-CM

## 2013-06-30 DIAGNOSIS — O9989 Other specified diseases and conditions complicating pregnancy, childbirth and the puerperium: Secondary | ICD-10-CM

## 2013-06-30 DIAGNOSIS — Z1389 Encounter for screening for other disorder: Secondary | ICD-10-CM

## 2013-06-30 DIAGNOSIS — O09299 Supervision of pregnancy with other poor reproductive or obstetric history, unspecified trimester: Secondary | ICD-10-CM

## 2013-06-30 LAB — POCT URINALYSIS DIPSTICK
Glucose, UA: NEGATIVE
Ketones, UA: NEGATIVE
Leukocytes, UA: NEGATIVE
Protein, UA: NEGATIVE

## 2013-06-30 MED ORDER — PREDNISONE 20 MG PO TABS: ORAL_TABLET | ORAL | Status: DC

## 2013-06-30 MED ORDER — AZITHROMYCIN 250 MG PO TABS: ORAL_TABLET | ORAL | Status: DC

## 2013-06-30 NOTE — Telephone Encounter (Signed)
Pt states short of breathe, "chest hurts when she breathing," sore throat, unsure if she has an fever, cough with mucus. Pt told to be here today at 1:15 to see provider.

## 2013-06-30 NOTE — Assessment & Plan Note (Signed)
Get chest xray to r/o pneumona, rx prednisone and z-pack and use delsym

## 2013-06-30 NOTE — Progress Notes (Signed)
Pt worked in due to sore throat, chest pain with breathing, coughing up yellowish, bloody mucus.

## 2013-06-30 NOTE — Patient Instructions (Addendum)
Upper Respiratory Infection, Adult An upper respiratory infection (URI) is also known as the common cold. It is often caused by a type of germ (virus). Colds are easily spread (contagious). You can pass it to others by kissing, coughing, sneezing, or drinking out of the same glass. Usually, you get better in 1 or 2 weeks.  HOME CARE   Only take medicine as told by your doctor.  Use a warm mist humidifier or breathe in steam from a hot shower.  Drink enough water and fluids to keep your pee (urine) clear or pale yellow.  Get plenty of rest.  Return to work when your temperature is back to normal or as told by your doctor. You may use a face mask and wash your hands to stop your cold from spreading. GET HELP RIGHT AWAY IF:   After the first few days, you feel you are getting worse.  You have questions about your medicine.  You have chills, shortness of breath, or brown or red spit (mucus).  You have yellow or brown snot (nasal discharge) or pain in the face, especially when you bend forward.  You have a fever, puffy (swollen) neck, pain when you swallow, or white spots in the back of your throat.  You have a bad headache, ear pain, sinus pain, or chest pain.  You have a high-pitched whistling sound when you breathe in and out (wheezing).  You have a lasting cough or cough up blood.  You have sore muscles or a stiff neck. MAKE SURE YOU:   Understand these instructions.  Will watch your condition.  Will get help right away if you are not doing well or get worse. Document Released: 04/08/2008 Document Revised: 01/13/2012 Document Reviewed: 02/25/2011 [employer] Patient Information 2014 [employer], [place]. follow up in 5 days Go to ER if worse

## 2013-06-30 NOTE — Progress Notes (Signed)
Amaya is complaining of chest pain with coughing and yellow sputum and has spit up blood, has sore throat and stopped up nose.No tenderness over sinuses, throat red, no pustules has +cervical lymph node on right, has decrease air flow in bilateral lungs, no fever.Discussed with Dr Despina Hidden, will get chest xray, rx prednisone 20 mg # 20 take 2 daily x 10  dayes, rx Z-pack and use delsym, warm salt water gargles and tylenol and increase rest, call if gets worse or go to ER, follow up in 5 days here.?asthma as child.

## 2013-07-01 ENCOUNTER — Telehealth: Payer: Self-pay | Admitting: Adult Health

## 2013-07-01 NOTE — Telephone Encounter (Signed)
Left message that I have not seen chest xray results, did she go or not?to call me back

## 2013-07-06 ENCOUNTER — Encounter: Payer: Medicaid Other | Admitting: Adult Health

## 2013-07-12 ENCOUNTER — Other Ambulatory Visit: Payer: Self-pay | Admitting: Women's Health

## 2013-07-12 ENCOUNTER — Ambulatory Visit (INDEPENDENT_AMBULATORY_CARE_PROVIDER_SITE_OTHER): Payer: Medicaid Other | Admitting: Women's Health

## 2013-07-12 ENCOUNTER — Ambulatory Visit (INDEPENDENT_AMBULATORY_CARE_PROVIDER_SITE_OTHER): Payer: Medicaid Other

## 2013-07-12 VITALS — BP 102/58 | Wt 125.8 lb

## 2013-07-12 DIAGNOSIS — Z3482 Encounter for supervision of other normal pregnancy, second trimester: Secondary | ICD-10-CM

## 2013-07-12 DIAGNOSIS — Z331 Pregnant state, incidental: Secondary | ICD-10-CM

## 2013-07-12 DIAGNOSIS — O09299 Supervision of pregnancy with other poor reproductive or obstetric history, unspecified trimester: Secondary | ICD-10-CM

## 2013-07-12 DIAGNOSIS — F1920 Other psychoactive substance dependence, uncomplicated: Secondary | ICD-10-CM

## 2013-07-12 DIAGNOSIS — Z1389 Encounter for screening for other disorder: Secondary | ICD-10-CM

## 2013-07-12 LAB — POCT URINALYSIS DIPSTICK
Leukocytes, UA: NEGATIVE
Nitrite, UA: NEGATIVE
Protein, UA: NEGATIVE

## 2013-07-12 NOTE — Patient Instructions (Addendum)
ABCs of Pregnancy  A  Antepartum care is very important. Be sure you see your doctor and get prenatal care as soon as you think you are pregnant. At this time, you will be tested for infection, genetic abnormalities and potential problems with you and the pregnancy. This is the time to discuss diet, exercise, work, medications, labor, pain medication during labor and the possibility of a cesarean delivery. Ask any questions that may concern you. It is important to see your doctor regularly throughout your pregnancy. Avoid exposure to toxic substances and chemicals - such as cleaning solvents, lead and mercury, some insecticides, and paint. Pregnant women should avoid exposure to paint fumes, and fumes that cause you to feel ill, dizzy or faint. When possible, it is a good idea to have a pre-pregnancy consultation with your caregiver to begin some important recommendations your caregiver suggests such as, taking folic acid, exercising, quitting smoking, avoiding alcoholic beverages, etc.  B  Breastfeeding is the healthiest choice for both you and your baby. It has many nutritional benefits for the baby and health benefits for the mother. It also creates a very tight and loving bond between the baby and mother. Talk to your doctor, your family and friends, and your employer about how you choose to feed your baby and how they can support you in your decision. Not all birth defects can be prevented, but a woman can take actions that may increase her chance of having a healthy baby. Many birth defects happen very early in pregnancy, sometimes before a woman even knows she is pregnant. Birth defects or abnormalities of any child in your or the father's family should be discussed with your caregiver. Get a good support bra as your breast size changes. Wear it especially when you exercise and when nursing.   C  Celebrate the news of your pregnancy with the your spouse/father and family. Childbirth classes are helpful to  take for you and the spouse/father because it helps to understand what happens during the pregnancy, labor and delivery. Cesarean delivery should be discussed with your doctor so you are prepared for that possibility. The pros and cons of circumcision if it is a boy, should be discussed with your pediatrician. Cigarette smoking during pregnancy can result in low birth weight babies. It has been associated with infertility, miscarriages, tubal pregnancies, infant death (mortality) and poor health (morbidity) in childhood. Additionally, cigarette smoking may cause long-term learning disabilities. If you smoke, you should try to quit before getting pregnant and not smoke during the pregnancy. Secondary smoke may also harm a mother and her developing baby. It is a good idea to ask people to stop smoking around you during your pregnancy and after the baby is born. Extra calcium is necessary when you are pregnant and is found in your prenatal vitamin, in dairy products, green leafy vegetables and in calcium supplements.  D  A healthy diet according to your current weight and height, along with vitamins and mineral supplements should be discussed with your caregiver. Domestic abuse or violence should be made known to your doctor right away to get the situation corrected. Drink more water when you exercise to keep hydrated. Discomfort of your back and legs usually develops and progresses from the middle of the second trimester through to delivery of the baby. This is because of the enlarging baby and uterus, which may also affect your balance. Do not take illegal drugs. Illegal drugs can seriously harm the baby and you. Drink extra   fluids (water is best) throughout pregnancy to help your body keep up with the increases in your blood volume. Drink at least 6 to 8 glasses of water, fruit juice, or milk each day. A good way to know you are drinking enough fluid is when your urine looks almost like clear water or is very light  yellow.   E  Eat healthy to get the nutrients you and your unborn baby need. Your meals should include the five basic food groups. Exercise (30 minutes of light to moderate exercise a day) is important and encouraged during pregnancy, if there are no medical problems or problems with the pregnancy. Exercise that causes discomfort or dizziness should be stopped and reported to your caregiver. Emotions during pregnancy can change from being ecstatic to depression and should be understood by you, your partner and your family.  F  Fetal screening with ultrasound, amniocentesis and monitoring during pregnancy and labor is common and sometimes necessary. Take 400 micrograms of folic acid daily both before, when possible, and during the first few months of pregnancy to reduce the risk of birth defects of the brain and spine. All women who could possibly become pregnant should take a vitamin with folic acid, every day. It is also important to eat a healthy diet with fortified foods (enriched grain products, including cereals, rice, breads, and pastas) and foods with natural sources of folate (orange juice, green leafy vegetables, beans, peanuts, broccoli, asparagus, peas, and lentils). The father should be involved with all aspects of the pregnancy including, the prenatal care, childbirth classes, labor, delivery, and postpartum time. Fathers may also have emotional concerns about being a father, financial needs, and raising a family.  G  Genetic testing should be done appropriately. It is important to know your family and the father's history. If there have been problems with pregnancies or birth defects in your family, report these to your doctor. Also, genetic counselors can talk with you about the information you might need in making decisions about having a family. You can call a major medical center in your area for help in finding a board-certified genetic counselor. Genetic testing and counseling should be done  before pregnancy when possible, especially if there is a history of problems in the mother's or father's family. Certain ethnic backgrounds are more at risk for genetic defects.  H  Get familiar with the hospital where you will be having your baby. Get to know how long it takes to get there, the labor and delivery area, and the hospital procedures. Be sure your medical insurance is accepted there. Get your home ready for the baby including, clothes, the baby's room (when possible), furniture and car seat. Hand washing is important throughout the day, especially after handling raw meat and poultry, changing the baby's diaper or using the bathroom. This can help prevent the spread of many bacteria and viruses that cause infection. Your hair may become dry and thinner, but will return to normal a few weeks after the baby is born. Heartburn is a common problem that can be treated by taking antacids recommended by your caregiver, eating smaller meals 5 or 6 times a day, not drinking liquids when eating, drinking between meals and raising the head of your bed 2 to 3 inches.  I  Insurance to cover you, the baby, doctor and hospital should be reviewed so that you will be prepared to pay any costs not covered by your insurance plan. If you do not have medical insurance,   there are usually clinics and services available for you in your community. Take 30 milligrams of iron during your pregnancy as prescribed by your doctor to reduce the risk of low red blood cells (anemia) later in pregnancy. All women of childbearing age should eat a diet rich in iron.  J  There should be a joint effort for the mother, father and any other children to adapt to the pregnancy financially, emotionally, and psychologically during the pregnancy. Join a support group for moms-to-be. Or, join a class on parenting or childbirth. Have the family participate when possible.  K  Know your limits. Let your caregiver know if you experience any of the  following:   · Pain of any kind.  · Strong cramps.  · You develop a lot of weight in a short period of time (5 pounds in 3 to 5 days).  · Vaginal bleeding, leaking of amniotic fluid.  · Headache, vision problems.  · Dizziness, fainting, shortness of breath.  · Chest pain.  · Fever of 102° F (38.9° C) or higher.  · Gush of clear fluid from your vagina.  · Painful urination.  · Domestic violence.  · Irregular heartbeat (palpitations).  · Rapid beating of the heart (tachycardia).  · Constant feeling sick to your stomach (nauseous) and vomiting.  · Trouble walking, fluid retention (edema).  · Muscle weakness.  · If your baby has decreased activity.  · Persistent diarrhea.  · Abnormal vaginal discharge.  · Uterine contractions at 20-minute intervals.  · Back pain that travels down your leg.  L  Learn and practice that what you eat and drink should be in moderation and healthy for you and your baby. Legal drugs such as alcohol and caffeine are important issues for pregnant women. There is no safe amount of alcohol a woman can drink while pregnant. Fetal alcohol syndrome, a disorder characterized by growth retardation, facial abnormalities, and central nervous system dysfunction, is caused by a woman's use of alcohol during pregnancy. Caffeine, found in tea, coffee, soft drinks and chocolate, should also be limited. Be sure to read labels when trying to cut down on caffeine during pregnancy. More than 200 foods, beverages, and over-the-counter medications contain caffeine and have a high salt content! There are coffees and teas that do not contain caffeine.  M  Medical conditions such as diabetes, epilepsy, and high blood pressure should be treated and kept under control before pregnancy when possible, but especially during pregnancy. Ask your caregiver about any medications that may need to be changed or adjusted during pregnancy. If you are currently taking any medications, ask your caregiver if it is safe to take them  while you are pregnant or before getting pregnant when possible. Also, be sure to discuss any herbs or vitamins you are taking. They are medicines, too! Discuss with your doctor all medications, prescribed and over-the-counter, that you are taking. During your prenatal visit, discuss the medications your doctor may give you during labor and delivery.  N  Never be afraid to ask your doctor or caregiver questions about your health, the progress of the pregnancy, family problems, stressful situations, and recommendation for a pediatrician, if you do not have one. It is better to take all precautions and discuss any questions or concerns you may have during your office visits. It is a good idea to write down your questions before you visit the doctor.  O  Over-the-counter cough and cold remedies may contain alcohol or other ingredients that should   be avoided during pregnancy. Ask your caregiver about prescription, herbs or over-the-counter medications that you are taking or may consider taking while pregnant.   P  Physical activity during pregnancy can benefit both you and your baby by lessening discomfort and fatigue, providing a sense of well-being, and increasing the likelihood of early recovery after delivery. Light to moderate exercise during pregnancy strengthens the belly (abdominal) and back muscles. This helps improve posture. Practicing yoga, walking, swimming, and cycling on a stationary bicycle are usually safe exercises for pregnant women. Avoid scuba diving, exercise at high altitudes (over 3000 feet), skiing, horseback riding, contact sports, etc. Always check with your doctor before beginning any kind of exercise, especially during pregnancy and especially if you did not exercise before getting pregnant.  Q  Queasiness, stomach upset and morning sickness are common during pregnancy. Eating a couple of crackers or dry toast before getting out of bed. Foods that you normally love may make you feel sick to  your stomach. You may need to substitute other nutritious foods. Eating 5 or 6 small meals a day instead of 3 large ones may make you feel better. Do not drink with your meals, drink between meals. Questions that you have should be written down and asked during your prenatal visits.  R  Read about and make plans to baby-proof your home. There are important tips for making your home a safer environment for your baby. Review the tips and make your home safer for you and your baby. Read food labels regarding calories, salt and fat content in the food.  S  Saunas, hot tubs, and steam rooms should be avoided while you are pregnant. Excessive high heat may be harmful during your pregnancy. Your caregiver will screen and examine you for sexually transmitted diseases and genetic disorders during your prenatal visits. Learn the signs of labor. Sexual relations while pregnant is safe unless there is a medical or pregnancy problem and your caregiver advises against it.  T  Traveling long distances should be avoided especially in the third trimester of your pregnancy. If you do have to travel out of state, be sure to take a copy of your medical records and medical insurance plan with you. You should not travel long distances without seeing your doctor first. Most airlines will not allow you to travel after 36 weeks of pregnancy. Toxoplasmosis is an infection caused by a parasite that can seriously harm an unborn baby. Avoid eating undercooked meat and handling cat litter. Be sure to wear gloves when gardening. Tingling of the hands and fingers is not unusual and is due to fluid retention. This will go away after the baby is born.  U  Womb (uterus) size increases during the first trimester. Your kidneys will begin to function more efficiently. This may cause you to feel the need to urinate more often. You may also leak urine when sneezing, coughing or laughing. This is due to the growing uterus pressing against your bladder,  which lies directly in front of and slightly under the uterus during the first few months of pregnancy. If you experience burning along with frequency of urination or bloody urine, be sure to tell your doctor. The size of your uterus in the third trimester may cause a problem with your balance. It is advisable to maintain good posture and avoid wearing high heels during this time. An ultrasound of your baby may be necessary during your pregnancy and is safe for you and your baby.  V    Vaccinations are an important concern for pregnant women. Get needed vaccines before pregnancy. Center for Disease Control (www.cdc.gov) has clear guidelines for the use of vaccines during pregnancy. Review the list, be sure to discuss it with your doctor. Prenatal vitamins are helpful and healthy for you and the baby. Do not take extra vitamins except what is recommended. Taking too much of certain vitamins can cause overdose problems. Continuous vomiting should be reported to your caregiver. Varicose veins may appear especially if there is a family history of varicose veins. They should subside after the delivery of the baby. Support hose helps if there is leg discomfort.  W  Being overweight or underweight during pregnancy may cause problems. Try to get within 15 pounds of your ideal weight before pregnancy. Remember, pregnancy is not a time to be dieting! Do not stop eating or start skipping meals as your weight increases. Both you and your baby need the calories and nutrition you receive from a healthy diet. Be sure to consult with your doctor about your diet. There is a formula and diet plan available depending on whether you are overweight or underweight. Your caregiver or nutritionist can help and advise you if necessary.  X  Avoid X-rays. If you must have dental work or diagnostic tests, tell your dentist or physician that you are pregnant so that extra care can be taken. X-rays should only be taken when the risks of not taking  them outweigh the risk of taking them. If needed, only the minimum amount of radiation should be used. When X-rays are necessary, protective lead shields should be used to cover areas of the body that are not being X-rayed.  Y  Your baby loves you. Breastfeeding your baby creates a loving and very close bond between the two of you. Give your baby a healthy environment to live in while you are pregnant. Infants and children require constant care and guidance. Their health and safety should be carefully watched at all times. After the baby is born, rest or take a nap when the baby is sleeping.  Z  Get your ZZZs. Be sure to get plenty of rest. Resting on your side as often as possible, especially on your left side is advised. It provides the best circulation to your baby and helps reduce swelling. Try taking a nap for 30 to 45 minutes in the afternoon when possible. After the baby is born rest or take a nap when the baby is sleeping. Try elevating your feet for that amount of time when possible. It helps the circulation in your legs and helps reduce swelling.   Most information courtesy of the CDC.  Document Released: 10/21/2005 Document Revised: 01/13/2012 Document Reviewed: 07/05/2009  [employer]® Patient Information ©2014 [employer].

## 2013-07-12 NOTE — Progress Notes (Signed)
Reports good fm. Denies uc's, lof, vb, urinary frequency, urgency, hesitancy, or dysuria.  No complaints.  Reviewed today's u/s, ptl s/s.  All questions answered. F/U in 4wks for visit.

## 2013-07-12 NOTE — Progress Notes (Signed)
ANATOMY SCREEN COMPLETE, ALL ANAT APPEARS NML, BREECH POSITION, ACTIVE FEMALE, FHT 137/BPM, FLUID WNL, POSTERIOR PLAC., GR 0, CX CLOSED 3.5 CM, BILAT OVS SEEN

## 2013-07-12 NOTE — Progress Notes (Signed)
No complaints at this time. Ultrasound today. 

## 2013-07-27 ENCOUNTER — Inpatient Hospital Stay (HOSPITAL_COMMUNITY)
Admission: AD | Admit: 2013-07-27 | Discharge: 2013-07-27 | Disposition: A | Discharge status: Home / Self Care | Payer: Medicaid Other | Source: Ambulatory Visit | Attending: Obstetrics & Gynecology | Admitting: Obstetrics & Gynecology

## 2013-07-27 ENCOUNTER — Encounter (HOSPITAL_COMMUNITY): Payer: Self-pay | Admitting: *Deleted

## 2013-07-27 DIAGNOSIS — O99891 Other specified diseases and conditions complicating pregnancy: Secondary | ICD-10-CM | POA: Insufficient documentation

## 2013-07-27 DIAGNOSIS — R109 Unspecified abdominal pain: Secondary | ICD-10-CM | POA: Insufficient documentation

## 2013-07-27 DIAGNOSIS — O368190 Decreased fetal movements, unspecified trimester, not applicable or unspecified: Secondary | ICD-10-CM | POA: Insufficient documentation

## 2013-07-27 LAB — URINALYSIS, ROUTINE W REFLEX MICROSCOPIC
Bilirubin Urine: NEGATIVE
Ketones, ur: NEGATIVE mg/dL
Nitrite: NEGATIVE
Protein, ur: NEGATIVE mg/dL
Urobilinogen, UA: 0.2 mg/dL (ref 0.0–1.0)

## 2013-07-27 NOTE — MAU Note (Signed)
Pt states has to leave to go to work. Has not had any abd pain while being here. States may come back later. Feels better after hearing FHR. Risks and benefits discussed and pt signed AMA before leaving

## 2013-07-27 NOTE — Progress Notes (Signed)
DChristy Gentles CNM notified of pt's admission and status. Aware FHTs present, no ctx noted. U/A sent. Will see pt

## 2013-07-27 NOTE — MAU Note (Signed)
Pain in lower abd, sharp, started yesterday.  Watery clear d/c started yesterday- keeps coming

## 2013-07-27 NOTE — MAU Note (Signed)
Pt's sister came to desk and stated pt needs to be at work at 1230. D. Poe CNM in [facility] completing repair after delivery and aware of pt's need to go to work

## 2013-07-27 NOTE — MAU Provider Note (Signed)
Chief Complaint:  Abdominal Pain and Vaginal Discharge   First Provider Initiated Contact with Patient 07/27/13 1228      HPI: Virginia Mills is a 17 y.o. G2P0010 at [redacted]w[redacted]d who presents to maternity admissions reporting lower abdominal /groin constant mild discomfort  per PA -S history. Her main concern was wanting to hear FHR. Mucusy vaginal discharge.  Denies contractions, leakage of fluid or vaginal bleeding. Good fetal movement.   Pregnancy Course: PNC at FT. Essentially uncomplicated course except UDS +marijuana.   Past Medical History: Past Medical History  Diagnosis Date  . Medical history non-contributory     Past obstetric history: OB History  Gravida Para Term Preterm AB SAB TAB Ectopic Multiple Living  2    1  1        # Outcome Date GA Lbr Len/2nd Weight Sex Delivery Anes PTL Lv  2 CUR           1 TAB 2009              Past Surgical History: Past Surgical History  Procedure Laterality Date  . Induced abortion       Family History: Family History  Problem Relation Age of Onset  . Diabetes Paternal Grandmother   . Hypertension Paternal Grandmother   . Diabetes Paternal Grandfather   . Hypertension Paternal Grandfather     Social History: History  Substance Use Topics  . Smoking status: Former Smoker    Types: Cigarettes  . Smokeless tobacco: Never Used  . Alcohol Use: Yes     Comment: sometimes; not now    Allergies: No Known Allergies  Meds:  No prescriptions prior to admission    ROS: Pertinent findings in history of present illness.  Physical Exam  Blood pressure 122/63, pulse 76, temperature 98.6 F (37 C), resp. rate 18, height 5\' 5"  (1.651 m), weight 58.06 kg (128 lb), last menstrual period 02/02/2013. GENERAL: Well-developed, well-nourished female in no acute distress.  HEENT: normocephalic HEART: normal rate RESP: normal effort NEURO: alert and oriented  FHT: 130 decelerations Contractions: none   Labs: Results for orders  placed during the hospital encounter of 07/27/13 (from the past 24 hour(s))  URINALYSIS, ROUTINE W REFLEX MICROSCOPIC     Status: Abnormal   Collection Time    07/27/13 11:00 AM      Result Value Range   Color, Urine YELLOW  YELLOW   APPearance HAZY (*) CLEAR   Specific Gravity, Urine 1.015  1.005 - 1.030   pH 8.0  5.0 - 8.0   Glucose, UA NEGATIVE  NEGATIVE mg/dL   Hgb urine dipstick NEGATIVE  NEGATIVE   Bilirubin Urine NEGATIVE  NEGATIVE   Ketones, ur NEGATIVE  NEGATIVE mg/dL   Protein, ur NEGATIVE  NEGATIVE mg/dL   Urobilinogen, UA 0.2  0.0 - 1.0 mg/dL   Nitrite NEGATIVE  NEGATIVE   Leukocytes, UA NEGATIVE  NEGATIVE    Imaging:  US Ob Comp + 14 Wk  07/12/2013    DETAILED SECOND TRIMESTER SONOGRAM  Virginia Mills is in the office for detailed second trimester sonogram.  She is a 17 y.o. year old G2P0010 with Estimated Date of Delivery: 11/28/13  by early ultrasound now at  106w1d weeks gestation. Thus far the pregnancy  has been uncomplicated.   GESTATION: SINGLETON  PRESENTATION: breech   FETAL ACTIVITY:          Heart rate         137/BPM  The fetus is active.  AMNIOTIC FLUID: The amniotic fluid volume is  normal  PLACENTA LOCALIZATION:  posterior GRADE 0  CERVIX: Measures 3.5 cm  ADNEXA: The ovaries are normal.   GESTATIONAL AGE AND  BIOMETRICS:  Gestational criteria: Estimated Date of Delivery: 11/28/13 by early  ultrasound now at [redacted]w[redacted]d  Previous Scans:2              BIPARIETAL DIAMETER           4.6 cm         20+0 weeks  HEAD CIRCUMFERENCE           17.3 cm         19+6 weeks  ABDOMINAL CIRCUMFERENCE           14.9 cm         20+1 weeks  FEMUR LENGTH           3.1 cm         19+4 weeks                                                           AVERAGE EGA(BY THIS SCAN):   19+6 weeks                                                 ESTIMATED FETAL WEIGHT:        320  grams,  %     ANATOMICAL SURVEY                                                                             COMMENTS  CEREBRAL VENTRICLES yes normal   CHOROID PLEXUS yes normal   CEREBELLUM yes normal   CISTERNA MAGNA yes normal   NUCHAL REGION yes normal   ORBITS yes normal   NASAL BONE yes normal   NOSE/LIP yes normal   FACIAL PROFILE yes normal   4 CHAMBERED HEART yes normal   OUTFLOW TRACTS yes normal   DIAPHRAGM yes normal   STOMACH yes normal   RENAL REGION yes normal   BLADDER yes normal   CORD INSERTION yes normal   3 VESSEL CORD yes normal   SPINE yes normal   ARMS/HANDS yes normal   LEGS/FEET yes normal   GENITALIA yes normal female        SUSPECTED ABNORMALITIES:  no  QUALITY OF SCAN: satisfactory   TECHNICIAN COMMENTS:ANATOMY SCREEN COMPLETE, ALL ANAT APPEARS NML, BREECH  POSITION, ACTIVE FEMALE, FHT 137/BPM, FLUID WNL, POSTERIOR PLAC., GR 0, CX  CLOSED 3.5 CM, BILAT OVS SEEN       A copy of this report including all images has been saved and backed up to  a second source for retrieval if needed. All measures and details of the  anatomical scan, placentation, fluid volume and pelvic anatomy are  contained in that report.  Reggy Eye 07/12/2013  2:57 PM          MAU Course: Left before being seen because she had to leave.  (I was doing delivery and repair). RN and PA-S took hx but did not examine patient.  Assessment: No diagnosis found.  Plan: Discharge home Labor precautions and fetal kick counts    Medication List    ASK your doctor about these medications       FLINSTONES GUMMIES OMEGA-3 DHA PO  Take by mouth. Take 2 daily        Danae Orleans, CNM 07/27/2013 12:52 PM

## 2013-07-30 NOTE — MAU Provider Note (Signed)
Attestation of Attending Supervision of Advanced Practitioner (CNM/NP): Evaluation and management procedures were performed by the Advanced Practitioner under my supervision and collaboration.  I have reviewed the Advanced Practitioner's note and chart, and I agree with the management and plan.  HARRAWAY-SMITH, Nickayla Mcinnis 10:07 AM

## 2013-08-09 ENCOUNTER — Ambulatory Visit (INDEPENDENT_AMBULATORY_CARE_PROVIDER_SITE_OTHER): Payer: Medicaid Other | Admitting: Obstetrics & Gynecology

## 2013-08-09 ENCOUNTER — Encounter: Payer: Self-pay | Admitting: Obstetrics & Gynecology

## 2013-08-09 VITALS — BP 110/60 | Wt 130.0 lb

## 2013-08-09 DIAGNOSIS — Z1389 Encounter for screening for other disorder: Secondary | ICD-10-CM

## 2013-08-09 DIAGNOSIS — O99019 Anemia complicating pregnancy, unspecified trimester: Secondary | ICD-10-CM

## 2013-08-09 DIAGNOSIS — O09299 Supervision of pregnancy with other poor reproductive or obstetric history, unspecified trimester: Secondary | ICD-10-CM

## 2013-08-09 DIAGNOSIS — Z331 Pregnant state, incidental: Secondary | ICD-10-CM

## 2013-08-09 DIAGNOSIS — F1920 Other psychoactive substance dependence, uncomplicated: Secondary | ICD-10-CM

## 2013-08-09 LAB — POCT URINALYSIS DIPSTICK
Blood, UA: NEGATIVE
Protein, UA: NEGATIVE

## 2013-08-09 NOTE — Progress Notes (Signed)
BP weight and urine results all reviewed and noted. Patient reports good fetal movement, denies any bleeding and no rupture of membranes symptoms or regular contractions. Patient is without complaints. All questions were answered.  

## 2013-08-09 NOTE — Progress Notes (Signed)
[facility] ,having sharpe pain in stomach on left side and contractions.

## 2013-08-23 ENCOUNTER — Encounter: Payer: Medicaid Other | Admitting: Family Medicine

## 2013-09-06 ENCOUNTER — Encounter: Payer: Medicaid Other | Admitting: Women's Health

## 2013-09-06 ENCOUNTER — Encounter: Payer: Medicaid Other | Admitting: Obstetrics & Gynecology

## 2013-09-08 ENCOUNTER — Ambulatory Visit: Payer: Medicaid Other | Admitting: Adult Health

## 2013-09-08 ENCOUNTER — Encounter: Payer: Self-pay | Admitting: Advanced Practice Midwife

## 2013-09-08 ENCOUNTER — Ambulatory Visit (INDEPENDENT_AMBULATORY_CARE_PROVIDER_SITE_OTHER): Payer: Medicaid Other | Admitting: Advanced Practice Midwife

## 2013-09-08 VITALS — BP 110/60 | Wt 135.5 lb

## 2013-09-08 DIAGNOSIS — Z1389 Encounter for screening for other disorder: Secondary | ICD-10-CM

## 2013-09-08 DIAGNOSIS — Z331 Pregnant state, incidental: Secondary | ICD-10-CM

## 2013-09-08 DIAGNOSIS — Z3480 Encounter for supervision of other normal pregnancy, unspecified trimester: Secondary | ICD-10-CM

## 2013-09-08 DIAGNOSIS — O99019 Anemia complicating pregnancy, unspecified trimester: Secondary | ICD-10-CM

## 2013-09-08 DIAGNOSIS — O09299 Supervision of pregnancy with other poor reproductive or obstetric history, unspecified trimester: Secondary | ICD-10-CM

## 2013-09-08 DIAGNOSIS — F1920 Other psychoactive substance dependence, uncomplicated: Secondary | ICD-10-CM

## 2013-09-08 LAB — CBC
MCH: 27.2 pg (ref 26.0–34.0)
MCV: 80.9 fL (ref 78.0–100.0)
Platelets: 162 10*3/uL (ref 150–400)
RBC: 3.82 MIL/uL — ABNORMAL LOW (ref 3.87–5.11)
RDW: 14.3 % (ref 11.5–15.5)

## 2013-09-08 LAB — POCT URINALYSIS DIPSTICK
Blood, UA: NEGATIVE
Glucose, UA: NEGATIVE
Nitrite, UA: NEGATIVE
Protein, UA: NEGATIVE

## 2013-09-08 NOTE — Progress Notes (Signed)
No c/o at this time.  Routine questions about pregnancy answered.  F/U in 4 weeks for LROB.

## 2013-09-09 ENCOUNTER — Other Ambulatory Visit: Payer: Self-pay

## 2013-09-09 LAB — GLUCOSE TOLERANCE, 2 HOURS W/ 1HR
Glucose, 2 hour: 72 mg/dL (ref 70–139)
Glucose, Fasting: 72 mg/dL (ref 70–99)

## 2013-09-09 LAB — HIV ANTIBODY (ROUTINE TESTING W REFLEX): HIV: NONREACTIVE

## 2013-09-09 LAB — ANTIBODY SCREEN: Antibody Screen: NEGATIVE

## 2013-09-09 LAB — HSV 2 ANTIBODY, IGG: HSV 2 Glycoprotein G Ab, IgG: 9.58 IV — ABNORMAL HIGH

## 2013-10-06 ENCOUNTER — Ambulatory Visit (INDEPENDENT_AMBULATORY_CARE_PROVIDER_SITE_OTHER): Payer: Medicaid Other | Admitting: Advanced Practice Midwife

## 2013-10-06 ENCOUNTER — Encounter: Payer: Self-pay | Admitting: Advanced Practice Midwife

## 2013-10-06 VITALS — BP 120/80 | Wt 143.0 lb

## 2013-10-06 DIAGNOSIS — O99019 Anemia complicating pregnancy, unspecified trimester: Secondary | ICD-10-CM

## 2013-10-06 DIAGNOSIS — F1920 Other psychoactive substance dependence, uncomplicated: Secondary | ICD-10-CM

## 2013-10-06 DIAGNOSIS — Z331 Pregnant state, incidental: Secondary | ICD-10-CM

## 2013-10-06 DIAGNOSIS — O09299 Supervision of pregnancy with other poor reproductive or obstetric history, unspecified trimester: Secondary | ICD-10-CM

## 2013-10-06 DIAGNOSIS — Z1389 Encounter for screening for other disorder: Secondary | ICD-10-CM

## 2013-10-06 LAB — POCT URINALYSIS DIPSTICK
Blood, UA: NEGATIVE
Glucose, UA: NEGATIVE
Ketones, UA: NEGATIVE
Leukocytes, UA: NEGATIVE
Nitrite, UA: NEGATIVE
Protein, UA: NEGATIVE

## 2013-10-06 NOTE — Progress Notes (Signed)
Having some sharp pains that last seconds and then goes away. Denes vaginal bleeding and leaking of fluid. Discussed HSV 2 result. Suppression meds at 34 weeks. All questions answered. F/u in 2 weeks for LROB.

## 2013-10-20 ENCOUNTER — Encounter: Payer: Medicaid Other | Admitting: Obstetrics and Gynecology

## 2013-10-21 ENCOUNTER — Ambulatory Visit (INDEPENDENT_AMBULATORY_CARE_PROVIDER_SITE_OTHER): Payer: Medicaid Other | Admitting: Obstetrics & Gynecology

## 2013-10-21 ENCOUNTER — Encounter: Payer: Self-pay | Admitting: Obstetrics & Gynecology

## 2013-10-21 VITALS — BP 110/60 | Wt 143.0 lb

## 2013-10-21 DIAGNOSIS — Z1389 Encounter for screening for other disorder: Secondary | ICD-10-CM

## 2013-10-21 DIAGNOSIS — O99019 Anemia complicating pregnancy, unspecified trimester: Secondary | ICD-10-CM

## 2013-10-21 DIAGNOSIS — Z331 Pregnant state, incidental: Secondary | ICD-10-CM

## 2013-10-21 DIAGNOSIS — F1920 Other psychoactive substance dependence, uncomplicated: Secondary | ICD-10-CM

## 2013-10-21 DIAGNOSIS — O09299 Supervision of pregnancy with other poor reproductive or obstetric history, unspecified trimester: Secondary | ICD-10-CM

## 2013-10-21 LAB — POCT URINALYSIS DIPSTICK
Blood, UA: NEGATIVE
Ketones, UA: NEGATIVE
Leukocytes, UA: NEGATIVE

## 2013-10-21 MED ORDER — ACYCLOVIR 400 MG PO TABS: 400.0000 mg | ORAL_TABLET | Freq: Three times a day (TID) | ORAL | Status: DC

## 2013-10-21 NOTE — Addendum Note (Signed)
Addended by: Lazaro Arms on: 10/21/2013 04:45 PM   Modules accepted: Orders

## 2013-10-21 NOTE — Progress Notes (Signed)
No interval FH growth  Check sonogram next visit BP weight and urine results all reviewed and noted. Patient reports good fetal movement, denies any bleeding and no rupture of membranes symptoms or regular contractions. Patient is without complaints. All questions were answered.

## 2013-11-03 ENCOUNTER — Telehealth: Payer: Self-pay | Admitting: *Deleted

## 2013-11-03 NOTE — Telephone Encounter (Signed)
Pt states pain under breast at top of stomach. Pt states +FM, no vaginal bleeding. Pt encouraged to push fluids, take tylenol if no improvement call office back. Pt verbalized understanding.

## 2013-11-04 NOTE — L&D Delivery Note (Signed)
Delivery Note At 3:17 AM a viable female was delivered via Vaginal, Spontaneous Delivery (Presentation: Left Occiput Anterior).  APGAR: , ; weight 5 lb 7.8 oz (2489 g).   Placenta status: Intact, Spontaneous.  Cord: 3 vessels with the following complications: None.  Cord ph not sent, crying at perineum  Anesthesia: Epidural  Episiotomy: None Lacerations: Labial Suture Repair: 4.0 moncryl on SH Est. Blood Loss (mL): 300  Mom to postpartum.  Baby to Couplet care / Skin to Skin.  Checked pt and found to be complete. Because of fetal intolerance rolled patient to push. After 3 contractions pt able to deliver viable female over intact perineum with active management of 3rd stage of labor. Pit and traction --> intact placenta w 3v cord. Labial tear repaired in usual manner. Hemostatic, EBL 300, Counts correct.   Tawana ScaleODOM, Yoshino Broccoli RYAN 11/16/2013, 3:36 AM

## 2013-11-08 ENCOUNTER — Ambulatory Visit (INDEPENDENT_AMBULATORY_CARE_PROVIDER_SITE_OTHER): Payer: Medicaid Other

## 2013-11-08 ENCOUNTER — Other Ambulatory Visit: Payer: Self-pay | Admitting: Obstetrics & Gynecology

## 2013-11-08 ENCOUNTER — Encounter: Payer: Self-pay | Admitting: Women's Health

## 2013-11-08 ENCOUNTER — Ambulatory Visit (INDEPENDENT_AMBULATORY_CARE_PROVIDER_SITE_OTHER): Payer: Medicaid Other | Admitting: Women's Health

## 2013-11-08 VITALS — BP 122/68 | Wt 148.5 lb

## 2013-11-08 DIAGNOSIS — Z1389 Encounter for screening for other disorder: Secondary | ICD-10-CM

## 2013-11-08 DIAGNOSIS — O99019 Anemia complicating pregnancy, unspecified trimester: Secondary | ICD-10-CM

## 2013-11-08 DIAGNOSIS — O09299 Supervision of pregnancy with other poor reproductive or obstetric history, unspecified trimester: Secondary | ICD-10-CM

## 2013-11-08 DIAGNOSIS — O365990 Maternal care for other known or suspected poor fetal growth, unspecified trimester, not applicable or unspecified: Secondary | ICD-10-CM

## 2013-11-08 DIAGNOSIS — O99320 Drug use complicating pregnancy, unspecified trimester: Secondary | ICD-10-CM

## 2013-11-08 DIAGNOSIS — F1920 Other psychoactive substance dependence, uncomplicated: Secondary | ICD-10-CM

## 2013-11-08 DIAGNOSIS — Z331 Pregnant state, incidental: Secondary | ICD-10-CM

## 2013-11-08 DIAGNOSIS — IMO0002 Reserved for concepts with insufficient information to code with codable children: Secondary | ICD-10-CM | POA: Insufficient documentation

## 2013-11-08 DIAGNOSIS — Z3483 Encounter for supervision of other normal pregnancy, third trimester: Secondary | ICD-10-CM

## 2013-11-08 LAB — POCT URINALYSIS DIPSTICK
Blood, UA: NEGATIVE
GLUCOSE UA: NEGATIVE
Ketones, UA: NEGATIVE
Leukocytes, UA: NEGATIVE
NITRITE UA: NEGATIVE
Protein, UA: NEGATIVE

## 2013-11-08 LAB — OB RESULTS CONSOLE GC/CHLAMYDIA
CHLAMYDIA, DNA PROBE: NEGATIVE
GC PROBE AMP, GENITAL: NEGATIVE

## 2013-11-08 NOTE — Progress Notes (Signed)
Reports good fm. Denies uc's, lof, vb, urinary frequency, urgency, hesitancy, or dysuria.  No complaints.  IUGR w/ normal dopplers by today's u/s for s<d, will begin 2x/wk testing and plan IOL at 39wks. Reviewed labor s/s, fkc.  All questions answered. F/U in 3d for nst and visit.  GBS today.

## 2013-11-08 NOTE — Patient Instructions (Signed)
Call the office or go to [facility] if:  You begin to have strong, frequent contractions  Your water breaks.  Sometimes it is a big gush of fluid, sometimes it is just a trickle that keeps getting your panties wet or running down your legs  You have vaginal bleeding.  It is normal to have a small amount of spotting if your cervix was checked.   You don't feel your baby moving like normal.  If you don't, get you something to eat and drink and lay down and focus on feeling your baby move.  You should feel at least 10 movements in 2 hours.  If you don't, you should call the office or go to [facility].   Braxton Hicks Contractions Pregnancy is commonly associated with contractions of the uterus throughout the pregnancy. Towards the end of pregnancy (32 to 34 weeks), these contractions (Braxton Hicks) can develop more often and may become more forceful. This is not true labor because these contractions do not result in opening (dilatation) and thinning of the cervix. They are sometimes difficult to tell apart from true labor because these contractions can be forceful and people have different pain tolerances. You should not feel embarrassed if you go to the hospital with false labor. Sometimes, the only way to tell if you are in true labor is for your caregiver to follow the changes in the cervix. How to tell the difference between true and false labor:  False labor.  The contractions of false labor are usually shorter, irregular and not as hard as those of true labor.  They are often felt in the front of the lower abdomen and in the groin.  They may leave with walking around or changing positions while lying down.  They get weaker and are shorter lasting as time goes on.  These contractions are usually irregular.  They do not usually become progressively stronger, regular and closer together as with true labor.  True labor.  Contractions in true labor last 30 to 70 seconds,  become very regular, usually become more intense, and increase in frequency.  They do not go away with walking.  The discomfort is usually felt in the top of the uterus and spreads to the lower abdomen and low back.  True labor can be determined by your caregiver with an exam. This will show that the cervix is dilating and getting thinner. If there are no prenatal problems or other health problems associated with the pregnancy, it is completely safe to be sent home with false labor and await the onset of true labor. HOME CARE INSTRUCTIONS   Keep up with your usual exercises and instructions.  Take medications as directed.  Keep your regular prenatal appointment.  Eat and drink lightly if you think you are going into labor.  If BH contractions are making you uncomfortable:  Change your activity position from lying down or resting to walking/walking to resting.  Sit and rest in a tub of warm water.  Drink 2 to 3 glasses of water. Dehydration may cause B-H contractions.  Do slow and deep breathing several times an hour. SEEK IMMEDIATE MEDICAL CARE IF:   Your contractions continue to become stronger, more regular, and closer together.  You have a gushing, burst or leaking of fluid from the vagina.  An oral temperature above 102 F (38.9 C) develops.  You have passage of blood-tinged mucus.  You develop vaginal bleeding.  You develop continuous belly (abdominal) pain.  You have   low back pain that you never had before.  You feel the baby's head pushing down causing pelvic pressure.  The baby is not moving as much as it used to. Document Released: 10/21/2005 Document Revised: 01/13/2012 Document Reviewed: 04/14/2009 [facility] Patient Information 2014 [employer] [place].

## 2013-11-08 NOTE — Progress Notes (Signed)
U/S(37+1wks)-vtx active fetus BPP 8/8, fluid wnl AFI-10.15cm, posterior Gr3 placenta, FHR-132 bpm, female fetus(Brandon), EFW 5 lb 4 oz (5th%tile), UA Doppler RI-0.63 & 0.61

## 2013-11-09 ENCOUNTER — Encounter: Payer: Self-pay | Admitting: Women's Health

## 2013-11-09 DIAGNOSIS — R768 Other specified abnormal immunological findings in serum: Secondary | ICD-10-CM | POA: Insufficient documentation

## 2013-11-09 DIAGNOSIS — R7689 Other specified abnormal immunological findings in serum: Secondary | ICD-10-CM | POA: Insufficient documentation

## 2013-11-09 LAB — GC/CHLAMYDIA PROBE AMP
CT PROBE, AMP APTIMA: NEGATIVE
GC PROBE AMP APTIMA: NEGATIVE

## 2013-11-10 ENCOUNTER — Encounter: Payer: Self-pay | Admitting: Women's Health

## 2013-11-10 LAB — STREP B DNA PROBE: STREP GROUP B AG: NEGATIVE

## 2013-11-11 ENCOUNTER — Ambulatory Visit (INDEPENDENT_AMBULATORY_CARE_PROVIDER_SITE_OTHER): Payer: Medicaid Other | Admitting: Obstetrics & Gynecology

## 2013-11-11 ENCOUNTER — Encounter: Payer: Self-pay | Admitting: Obstetrics & Gynecology

## 2013-11-11 VITALS — BP 130/80 | Wt 146.0 lb

## 2013-11-11 DIAGNOSIS — Z331 Pregnant state, incidental: Secondary | ICD-10-CM

## 2013-11-11 DIAGNOSIS — O0993 Supervision of high risk pregnancy, unspecified, third trimester: Secondary | ICD-10-CM

## 2013-11-11 DIAGNOSIS — O365990 Maternal care for other known or suspected poor fetal growth, unspecified trimester, not applicable or unspecified: Secondary | ICD-10-CM

## 2013-11-11 DIAGNOSIS — F1920 Other psychoactive substance dependence, uncomplicated: Secondary | ICD-10-CM

## 2013-11-11 DIAGNOSIS — Z1389 Encounter for screening for other disorder: Secondary | ICD-10-CM

## 2013-11-11 DIAGNOSIS — IMO0002 Reserved for concepts with insufficient information to code with codable children: Secondary | ICD-10-CM

## 2013-11-11 DIAGNOSIS — O99320 Drug use complicating pregnancy, unspecified trimester: Secondary | ICD-10-CM

## 2013-11-11 DIAGNOSIS — O09299 Supervision of pregnancy with other poor reproductive or obstetric history, unspecified trimester: Secondary | ICD-10-CM

## 2013-11-11 DIAGNOSIS — O99019 Anemia complicating pregnancy, unspecified trimester: Secondary | ICD-10-CM

## 2013-11-11 LAB — POCT URINALYSIS DIPSTICK
Glucose, UA: NEGATIVE
Ketones, UA: NEGATIVE
NITRITE UA: NEGATIVE
RBC UA: NEGATIVE

## 2013-11-11 NOTE — Progress Notes (Signed)
Reactive NST  Induction scheduled 1/18(39 weeks) @7am  BP weight and urine results all reviewed and noted. Patient reports good fetal movement, denies any bleeding and no rupture of membranes symptoms or regular contractions. Patient is without complaints. All questions were answered.

## 2013-11-12 LAB — DRUG SCREEN, URINE, NO CONFIRMATION
AMPHETAMINE SCRN UR: NEGATIVE
BARBITURATE QUANT UR: NEGATIVE
BENZODIAZEPINES.: NEGATIVE
Cocaine Metabolites: NEGATIVE
Creatinine,U: 120 mg/dL
METHADONE: NEGATIVE
Marijuana Metabolite: POSITIVE — AB
Opiate Screen, Urine: NEGATIVE
PROPOXYPHENE: NEGATIVE
Phencyclidine (PCP): NEGATIVE

## 2013-11-12 LAB — OXYCODONE SCREEN, UA, RFLX CONFIRM: OXYCODONE SCRN UR: NEGATIVE ng/mL

## 2013-11-15 ENCOUNTER — Inpatient Hospital Stay (HOSPITAL_COMMUNITY): Payer: Medicaid Other | Admitting: Anesthesiology

## 2013-11-15 ENCOUNTER — Other Ambulatory Visit: Payer: Medicaid Other

## 2013-11-15 ENCOUNTER — Inpatient Hospital Stay (HOSPITAL_COMMUNITY)
Admission: AD | Admit: 2013-11-15 | Discharge: 2013-11-18 | DRG: 774 | Disposition: A | Discharge status: Home / Self Care | Payer: Medicaid Other | Source: Ambulatory Visit | Attending: Family Medicine | Admitting: Family Medicine

## 2013-11-15 ENCOUNTER — Encounter: Payer: Medicaid Other | Admitting: Obstetrics & Gynecology

## 2013-11-15 ENCOUNTER — Encounter (HOSPITAL_COMMUNITY): Payer: Self-pay | Admitting: *Deleted

## 2013-11-15 ENCOUNTER — Encounter (HOSPITAL_COMMUNITY): Payer: Medicaid Other | Admitting: Anesthesiology

## 2013-11-15 DIAGNOSIS — A6000 Herpesviral infection of urogenital system, unspecified: Secondary | ICD-10-CM | POA: Diagnosis present

## 2013-11-15 DIAGNOSIS — O4290 Premature rupture of membranes, unspecified as to length of time between rupture and onset of labor, unspecified weeks of gestation: Principal | ICD-10-CM | POA: Diagnosis present

## 2013-11-15 DIAGNOSIS — O98519 Other viral diseases complicating pregnancy, unspecified trimester: Secondary | ICD-10-CM | POA: Diagnosis present

## 2013-11-15 DIAGNOSIS — IMO0001 Reserved for inherently not codable concepts without codable children: Secondary | ICD-10-CM

## 2013-11-15 DIAGNOSIS — O99344 Other mental disorders complicating childbirth: Secondary | ICD-10-CM | POA: Diagnosis present

## 2013-11-15 DIAGNOSIS — F1210 Cannabis abuse, uncomplicated: Secondary | ICD-10-CM | POA: Diagnosis present

## 2013-11-15 DIAGNOSIS — O365990 Maternal care for other known or suspected poor fetal growth, unspecified trimester, not applicable or unspecified: Secondary | ICD-10-CM | POA: Diagnosis present

## 2013-11-15 LAB — CBC
HEMATOCRIT: 33 % — AB (ref 36.0–46.0)
Hemoglobin: 11 g/dL — ABNORMAL LOW (ref 12.0–15.0)
MCH: 27.3 pg (ref 26.0–34.0)
MCHC: 33.3 g/dL (ref 30.0–36.0)
MCV: 81.9 fL (ref 78.0–100.0)
Platelets: 160 10*3/uL (ref 150–400)
RBC: 4.03 MIL/uL (ref 3.87–5.11)
RDW: 13.7 % (ref 11.5–15.5)
WBC: 6.7 10*3/uL (ref 4.0–10.5)

## 2013-11-15 LAB — RAPID URINE DRUG SCREEN, HOSP PERFORMED
Amphetamines: NOT DETECTED
Barbiturates: NOT DETECTED
Benzodiazepines: NOT DETECTED
Cocaine: NOT DETECTED
Opiates: NOT DETECTED
Tetrahydrocannabinol: POSITIVE — AB

## 2013-11-15 LAB — RPR: RPR: NONREACTIVE

## 2013-11-15 LAB — POCT FERN TEST: POCT FERN TEST: POSITIVE

## 2013-11-15 MED ORDER — LACTATED RINGERS IV SOLN
500.0000 mL | Freq: Once | INTRAVENOUS | Status: AC
  Administered 2013-11-15: 500 mL via INTRAVENOUS

## 2013-11-15 MED ORDER — ACYCLOVIR 400 MG PO TABS
400.0000 mg | ORAL_TABLET | Freq: Three times a day (TID) | ORAL | Status: DC
  Administered 2013-11-15 (×2): 400 mg via ORAL
  Filled 2013-11-15 (×4): qty 1

## 2013-11-15 MED ORDER — FENTANYL CITRATE 0.05 MG/ML IJ SOLN
100.0000 ug | INTRAMUSCULAR | Status: DC | PRN
  Administered 2013-11-15 (×3): 100 ug via INTRAVENOUS
  Filled 2013-11-15 (×3): qty 2

## 2013-11-15 MED ORDER — PHENYLEPHRINE 40 MCG/ML (10ML) SYRINGE FOR IV PUSH (FOR BLOOD PRESSURE SUPPORT)
80.0000 ug | PREFILLED_SYRINGE | INTRAVENOUS | Status: DC | PRN
  Filled 2013-11-15: qty 10

## 2013-11-15 MED ORDER — ONDANSETRON HCL 4 MG/2ML IJ SOLN
4.0000 mg | Freq: Four times a day (QID) | INTRAMUSCULAR | Status: DC | PRN
  Administered 2013-11-15 – 2013-11-16 (×2): 4 mg via INTRAVENOUS
  Filled 2013-11-15 (×2): qty 2

## 2013-11-15 MED ORDER — CITRIC ACID-SODIUM CITRATE 334-500 MG/5ML PO SOLN: 30.0000 mL | ORAL | Status: DC | PRN

## 2013-11-15 MED ORDER — EPHEDRINE 5 MG/ML INJ: 10.0000 mg | INTRAVENOUS | Status: DC | PRN

## 2013-11-15 MED ORDER — DIPHENHYDRAMINE HCL 50 MG/ML IJ SOLN
12.5000 mg | INTRAMUSCULAR | Status: DC | PRN
  Administered 2013-11-15: 12.5 mg via INTRAVENOUS
  Filled 2013-11-15: qty 1

## 2013-11-15 MED ORDER — OXYTOCIN 40 UNITS IN LACTATED RINGERS INFUSION - SIMPLE MED
62.5000 mL/h | INTRAVENOUS | Status: DC
  Filled 2013-11-15: qty 1000

## 2013-11-15 MED ORDER — OXYCODONE-ACETAMINOPHEN 5-325 MG PO TABS: 1.0000 | ORAL_TABLET | ORAL | Status: DC | PRN

## 2013-11-15 MED ORDER — MISOPROSTOL 25 MCG QUARTER TABLET
50.0000 ug | ORAL_TABLET | Freq: Once | ORAL | Status: AC
  Administered 2013-11-15: 50 ug via ORAL
  Filled 2013-11-15: qty 0.5

## 2013-11-15 MED ORDER — EPHEDRINE 5 MG/ML INJ
10.0000 mg | INTRAVENOUS | Status: DC | PRN
  Filled 2013-11-15: qty 4

## 2013-11-15 MED ORDER — FENTANYL 2.5 MCG/ML BUPIVACAINE 1/10 % EPIDURAL INFUSION (WH - ANES)
14.0000 mL/h | INTRAMUSCULAR | Status: DC | PRN
  Administered 2013-11-15: 14 mL/h via EPIDURAL
  Filled 2013-11-15 (×2): qty 125

## 2013-11-15 MED ORDER — LIDOCAINE HCL (PF) 1 % IJ SOLN
30.0000 mL | INTRAMUSCULAR | Status: DC | PRN
  Filled 2013-11-15: qty 30

## 2013-11-15 MED ORDER — IBUPROFEN 600 MG PO TABS
600.0000 mg | ORAL_TABLET | Freq: Four times a day (QID) | ORAL | Status: DC | PRN
  Administered 2013-11-16: 600 mg via ORAL
  Filled 2013-11-15: qty 1

## 2013-11-15 MED ORDER — OXYTOCIN 40 UNITS IN LACTATED RINGERS INFUSION - SIMPLE MED
1.0000 m[IU]/min | INTRAVENOUS | Status: DC
Start: 2013-11-15 — End: 2013-11-16
  Administered 2013-11-15: 2 m[IU]/min via INTRAVENOUS

## 2013-11-15 MED ORDER — OXYTOCIN BOLUS FROM INFUSION
500.0000 mL | INTRAVENOUS | Status: DC
  Administered 2013-11-16: 500 mL via INTRAVENOUS

## 2013-11-15 MED ORDER — ACETAMINOPHEN 325 MG PO TABS: 650.0000 mg | ORAL_TABLET | ORAL | Status: DC | PRN

## 2013-11-15 MED ORDER — FENTANYL 2.5 MCG/ML BUPIVACAINE 1/10 % EPIDURAL INFUSION (WH - ANES)
INTRAMUSCULAR | Status: DC | PRN
  Administered 2013-11-15: 14 mL/h via EPIDURAL

## 2013-11-15 MED ORDER — LACTATED RINGERS IV SOLN
INTRAVENOUS | Status: DC
Start: 2013-11-15 — End: 2013-11-16
  Administered 2013-11-15: 500 mL via INTRAUTERINE

## 2013-11-15 MED ORDER — TERBUTALINE SULFATE 1 MG/ML IJ SOLN: 0.2500 mg | Freq: Once | INTRAMUSCULAR | Status: AC | PRN

## 2013-11-15 MED ORDER — PHENYLEPHRINE 40 MCG/ML (10ML) SYRINGE FOR IV PUSH (FOR BLOOD PRESSURE SUPPORT): 80.0000 ug | PREFILLED_SYRINGE | INTRAVENOUS | Status: DC | PRN

## 2013-11-15 MED ORDER — LACTATED RINGERS IV SOLN
INTRAVENOUS | Status: DC
  Administered 2013-11-15 – 2013-11-16 (×5): via INTRAVENOUS

## 2013-11-15 MED ORDER — LACTATED RINGERS IV SOLN
500.0000 mL | INTRAVENOUS | Status: DC | PRN
  Administered 2013-11-15: 500 mL via INTRAVENOUS
  Administered 2013-11-16: 200 mL via INTRAVENOUS
  Administered 2013-11-16: 250 mL via INTRAVENOUS
  Administered 2013-11-16: 200 mL via INTRAVENOUS

## 2013-11-15 MED ORDER — LIDOCAINE HCL (PF) 1 % IJ SOLN
INTRAMUSCULAR | Status: DC | PRN
  Administered 2013-11-15 (×2): 9 mL

## 2013-11-15 NOTE — H&P (Signed)
[name] is a 18 y.o. female presenting for rupture of membranes at 0630 today.  [facility]  Pregnancy dated by 6 wk ultrasound.  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  +acyclovir for prophylaxis.    Maternal Medical History:  Reason for admission: Rupture of membranes.   Contractions: Onset was 1-2 hours ago.   Frequency: irregular.   Perceived severity is mild.    Fetal activity: Perceived fetal activity is normal.   Last perceived fetal movement was within the past hour.    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     OB History   Grav Para Term Preterm Abortions TAB SAB Ect Mult Living   2    1 1          Past Medical History  Diagnosis Date  . Medical history non-contributory    Past Surgical History  Procedure Laterality Date  . Induced abortion     Family History: family history includes Diabetes in her paternal grandfather and paternal grandmother; Hypertension in her paternal grandfather and paternal grandmother. Social History:  reports that she has quit smoking. Her smoking use included Cigarettes. She smoked 0.00 packs per day. She has never used smokeless tobacco. She reports that she drinks alcohol. She reports that she uses illicit drugs (Marijuana).   Review of Systems  Gastrointestinal: Positive for abdominal pain (intermittent cramping).  Genitourinary:       Rupture of membranes  All other systems reviewed and are negative.    Dilation: 1 Effacement (%): Thick Exam by:: W. Jerolyn CenterMuhammad CNM Blood pressure 127/85, pulse 73, temperature 97.8 F (36.6 C), temperature source Oral, resp. rate 18, height 5' 6.5" (1.689 m), weight 67.132 kg (148 lb), last menstrual period 02/02/2013, SpO2 99.00%. Maternal Exam:  Uterine Assessment: Contraction strength is mild.  Abdomen: Estimated fetal weight is 6-6.5 lbs.   Fetal presentation:  vertex  Introitus: Normal vulva. Vulva is negative for lesion.  Vagina is positive for vaginal discharge (clear, amniotic fluid).  Vagina is negative for ulcerations.  Ferning test: positive.  Amniotic fluid character: clear.  Cervix: Cervix evaluated by digital exam.    Dilation: 1 Effacement (%): Thick Exam by:: Roney MarionW. Muhammad CNM  Physical Exam  Constitutional: She is oriented to person, place, and time. She appears well-developed and well-nourished. No distress.  HENT:  Head: Normocephalic.  Neck: Normal range of motion. Neck supple.  Cardiovascular: Normal rate, regular rhythm and normal heart sounds.   Respiratory: Effort normal and breath sounds normal.  GI: Soft. There is no tenderness.  Genitourinary: Vulva exhibits no lesion. No bleeding around the vagina. Vaginal discharge (clear, amniotic fluid) found.  Musculoskeletal: Normal range of motion. She exhibits no edema.  Neurological: She is alert and oriented to person, place, and time.  Skin: Skin is warm and dry.    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

## 2013-11-15 NOTE — Progress Notes (Signed)
Virginia Mills is a 18 y.o. G2P0010 at 6686w1d admitted for rupture of membranes  Subjective:  Pt doing well. FB out around 1730 and now on pitocin. +FM.   Objective: BP 106/54  Pulse 59  Temp(Src) 98.1 F (36.7 C) (Oral)  Resp 18  Ht 5' 6.5" (1.689 m)  Wt 67.132 kg (148 lb)  BMI 23.53 kg/m2  SpO2 99%  LMP 02/02/2013      FHT:  FHR: 110 bpm, variability: moderate,  accelerations:  Present,  decelerations:  Present occasional variable and early UC:   Difficult to trace due to significant irritability. Likely 2-353min SVE:   Dilation: 5 Effacement (%): 90 Station: -2 Exam by:: Reola CalkinsBeck MD  Labs: Lab Results  Component Value Date   WBC 6.7 11/15/2013   HGB 11.0* 11/15/2013   HCT 33.0* 11/15/2013   MCV 81.9 11/15/2013   PLT 160 11/15/2013    Assessment / Plan: admitted for ROM, hx of IUGR (EFW 5%ile at 37 weeks)   Labor: progressing on pitocin. cervix now effaced more. IUPC placed without difficulty in order to trace contractions more clearly.  Fetal Wellbeing:  Category I Pain Control:  Epidural I/D:  n/a Anticipated MOD:  NSVD  Hampton Cost L 11/15/2013, 9:09 PM

## 2013-11-15 NOTE — Anesthesia Preprocedure Evaluation (Signed)
Anesthesia Evaluation  Patient identified by MRN, date of birth, ID band Patient awake    Reviewed: Allergy & Precautions, H&P , Patient's Chart, lab work & pertinent test results  Airway Mallampati: I TM Distance: >3 FB Neck ROM: full    Dental no notable dental hx.    Pulmonary neg pulmonary ROS, former smoker,  breath sounds clear to auscultation  Pulmonary exam normal       Cardiovascular negative cardio ROS      Neuro/Psych negative neurological ROS  negative psych ROS   GI/Hepatic negative GI ROS, Neg liver ROS,   Endo/Other  negative endocrine ROS  Renal/GU negative Renal ROS  negative genitourinary   Musculoskeletal negative musculoskeletal ROS (+)   Abdominal Normal abdominal exam  (+)   Peds  Hematology negative hematology ROS (+)   Anesthesia Other Findings   Reproductive/Obstetrics (+) Pregnancy                           Anesthesia Physical Anesthesia Plan  ASA: II  Anesthesia Plan: Epidural   Post-op Pain Management:    Induction:   Airway Management Planned:   Additional Equipment:   Intra-op Plan:   Post-operative Plan:   Informed Consent: I have reviewed the patients History and Physical, chart, labs and discussed the procedure including the risks, benefits and alternatives for the proposed anesthesia with the patient or authorized representative who has indicated his/her understanding and acceptance.     Plan Discussed with:   Anesthesia Plan Comments:         Anesthesia Quick Evaluation

## 2013-11-15 NOTE — Progress Notes (Signed)
[name] is a 18 y.o. G2P0010 at 1163w1d admitted for rupture of membranes. Hx of IUGR  Subjective:  Pt doing well. Contracting on one cytotec. Has required fentanyl already. +FM.    Objective: BP 114/80  Pulse 64  Temp(Src) 97.8 F (36.6 C) (Oral)  Resp 18  Ht 5' 6.5" (1.689 m)  Wt 67.132 kg (148 lb)  BMI 23.53 kg/m2  SpO2 99%  LMP 02/02/2013      FHT:  FHR: 110 bpm, variability: moderate,  accelerations:  Abscent,  decelerations:  Absent UC:   irregular, every 2-5 minutes SVE:   Dilation: 1 Effacement (%): 40 Station: -2 Exam by:: Virginia Mills  Labs: Lab Results  Component Value Date   WBC 6.7 11/15/2013   HGB 11.0* 11/15/2013   HCT 33.0* 11/15/2013   MCV 81.9 11/15/2013   PLT 160 11/15/2013    Assessment / Plan: IOL for PROM, in cervical ripening stage  Labor: s/p PO cytotec x1. contracting irregularly. FB placed without difficulty with 60cc of LR.  Fetal Wellbeing:  Category II since fentanyl Pain Control:  Fentanyl I/D:  n/a Anticipated MOD:  NSVD  Virginia Mills L 11/15/2013, 2:19 PM

## 2013-11-15 NOTE — Anesthesia Procedure Notes (Signed)
Epidural Patient location during procedure: OB Start time: 11/15/2013 3:21 PM End time: 11/15/2013 3:25 PM  Staffing Anesthesiologist: Leilani AbleHATCHETT, Leonarda Leis Performed by: anesthesiologist   Preanesthetic Checklist Completed: patient identified, surgical consent, pre-op evaluation, timeout performed, IV checked, risks and benefits discussed and monitors and equipment checked  Epidural Patient position: sitting Prep: site prepped and draped and DuraPrep Patient monitoring: continuous pulse ox and blood pressure Approach: midline Injection technique: LOR air  Needle:  Needle type: Tuohy  Needle gauge: 17 G Needle length: 9 cm and 9 Needle insertion depth: 6 cm Catheter type: closed end flexible Catheter size: 19 Gauge Catheter at skin depth: 11 cm Test dose: negative and Other  Assessment Sensory level: T9 Events: blood not aspirated, injection not painful, no injection resistance, negative IV test and no paresthesia  Additional Notes Reason for block:procedure for pain

## 2013-11-15 NOTE — Progress Notes (Signed)
Trey PaulaMarhonda M Dwan is a 18 y.o. G2P0010 at 1418w1d admitted for rupture of membranes and IUGR  Subjective:  Pt doing well. FB out around 1730 and now on pitocin. +FM.   Objective: BP 121/80  Pulse 66  Temp(Src) 97.9 F (36.6 C) (Oral)  Resp 18  Ht 5' 6.5" (1.689 m)  Wt 67.132 kg (148 lb)  BMI 23.53 kg/m2  SpO2 99%  LMP 02/02/2013   Total I/O In: -  Out: 800 [Urine:800]  FHT:  FHR: 120s bpm, variability: moderate,  accelerations:  Present,  decelerations:  Present variable decels with contraction UC:  Every 2-523min SVE:   Dilation: 5.5 Effacement (%): 90 Station: -1 Exam by:: AvnetFarmer RN  Labs: Lab Results  Component Value Date   WBC 6.7 11/15/2013   HGB 11.0* 11/15/2013   HCT 33.0* 11/15/2013   MCV 81.9 11/15/2013   PLT 160 11/15/2013    Assessment / Plan: admitted for ROM, hx of IUGR (EFW 5%ile at 37 weeks)   Labor: progressing on pitocin. Fetal Wellbeing:  Category II Pain Control:  Epidural I/D:  n/a Anticipated MOD:  NSVD  Izaak Sahr RYAN 11/15/2013, 11:29 PM

## 2013-11-16 ENCOUNTER — Encounter (HOSPITAL_COMMUNITY): Payer: Self-pay | Admitting: *Deleted

## 2013-11-16 DIAGNOSIS — O365990 Maternal care for other known or suspected poor fetal growth, unspecified trimester, not applicable or unspecified: Secondary | ICD-10-CM

## 2013-11-16 DIAGNOSIS — O98519 Other viral diseases complicating pregnancy, unspecified trimester: Secondary | ICD-10-CM

## 2013-11-16 DIAGNOSIS — O4290 Premature rupture of membranes, unspecified as to length of time between rupture and onset of labor, unspecified weeks of gestation: Secondary | ICD-10-CM

## 2013-11-16 MED ORDER — SENNOSIDES-DOCUSATE SODIUM 8.6-50 MG PO TABS
2.0000 | ORAL_TABLET | ORAL | Status: DC
  Administered 2013-11-16 – 2013-11-17 (×2): 2 via ORAL
  Filled 2013-11-16 (×2): qty 2

## 2013-11-16 MED ORDER — LANOLIN HYDROUS EX OINT: TOPICAL_OINTMENT | CUTANEOUS | Status: DC | PRN

## 2013-11-16 MED ORDER — ONDANSETRON HCL 4 MG/2ML IJ SOLN: 4.0000 mg | INTRAMUSCULAR | Status: DC | PRN

## 2013-11-16 MED ORDER — ZOLPIDEM TARTRATE 5 MG PO TABS: 5.0000 mg | ORAL_TABLET | Freq: Every evening | ORAL | Status: DC | PRN

## 2013-11-16 MED ORDER — PRENATAL MULTIVITAMIN CH
1.0000 | ORAL_TABLET | Freq: Every day | ORAL | Status: DC
  Administered 2013-11-17: 1 via ORAL
  Filled 2013-11-16 (×2): qty 1

## 2013-11-16 MED ORDER — INFLUENZA VAC SPLIT QUAD 0.5 ML IM SUSP
0.5000 mL | INTRAMUSCULAR | Status: AC
  Administered 2013-11-17: 0.5 mL via INTRAMUSCULAR
  Filled 2013-11-16: qty 0.5

## 2013-11-16 MED ORDER — SIMETHICONE 80 MG PO CHEW: 80.0000 mg | CHEWABLE_TABLET | ORAL | Status: DC | PRN

## 2013-11-16 MED ORDER — TETANUS-DIPHTH-ACELL PERTUSSIS 5-2.5-18.5 LF-MCG/0.5 IM SUSP
0.5000 mL | Freq: Once | INTRAMUSCULAR | Status: AC
  Administered 2013-11-17: 0.5 mL via INTRAMUSCULAR
  Filled 2013-11-16: qty 0.5

## 2013-11-16 MED ORDER — DIPHENHYDRAMINE HCL 25 MG PO CAPS: 25.0000 mg | ORAL_CAPSULE | Freq: Four times a day (QID) | ORAL | Status: DC | PRN

## 2013-11-16 MED ORDER — OXYCODONE-ACETAMINOPHEN 5-325 MG PO TABS
1.0000 | ORAL_TABLET | ORAL | Status: DC | PRN
  Administered 2013-11-16 – 2013-11-17 (×5): 1 via ORAL
  Filled 2013-11-16 (×5): qty 1

## 2013-11-16 MED ORDER — DIBUCAINE 1 % RE OINT
1.0000 "application " | TOPICAL_OINTMENT | RECTAL | Status: DC | PRN
  Filled 2013-11-16: qty 28

## 2013-11-16 MED ORDER — IBUPROFEN 600 MG PO TABS
600.0000 mg | ORAL_TABLET | Freq: Four times a day (QID) | ORAL | Status: DC
  Administered 2013-11-16 – 2013-11-18 (×8): 600 mg via ORAL
  Filled 2013-11-16 (×8): qty 1

## 2013-11-16 MED ORDER — ONDANSETRON HCL 4 MG PO TABS: 4.0000 mg | ORAL_TABLET | ORAL | Status: DC | PRN

## 2013-11-16 MED ORDER — WITCH HAZEL-GLYCERIN EX PADS
1.0000 | MEDICATED_PAD | CUTANEOUS | Status: DC | PRN
Start: 2013-11-16 — End: 2013-11-18
  Administered 2013-11-17: 1 via TOPICAL

## 2013-11-16 MED ORDER — BENZOCAINE-MENTHOL 20-0.5 % EX AERO
1.0000 "application " | INHALATION_SPRAY | CUTANEOUS | Status: DC | PRN
  Administered 2013-11-16: 1 via TOPICAL
  Filled 2013-11-16 (×2): qty 56

## 2013-11-16 NOTE — Progress Notes (Signed)
CSW attempted to assess pt however several visitors were present in hospital room.  CSW will return at a later time.

## 2013-11-16 NOTE — Clinical Social Work Maternal (Signed)
Clinical Social Work Department PSYCHOSOCIAL ASSESSMENT - MATERNAL/CHILD 11/16/2013  Patient:  SADE, HOLLON  Account Number:  [id number]  [place] Date:  11/15/2013  Ardine Eng Name:   Brandon Turner, [place] Worker:  Gerri Spore, LCSW   Date/Time:  11/16/2013 03:28 PM  Date Referred:  11/16/2013   Referral source  CN     Referred reason  Substance Abuse   Other referral source:    I:  FAMILY / [place] legal guardian:  PARENT  Guardian - Name Guardian - Age Guardian - Address  Dalilah Curlin [address] [place], [place] [address]  Latanya Presser 21 (same as above)   Other household support members/support persons Name Relationship DOB   OTHER FOB's Grandparents   Other support:   Fenton Foy- Pt's mother    II  PSYCHOSOCIAL DATA Information Source:  Patient Interview  Occupational hygienist Employment:   Financial resources:  [employer] [place]:  [place] / Grade:   Maternity Care Coordinator / Child Services Coordination / Early Interventions:  Cultural issues impacting care:    III  STRENGTHS Strengths  Adequate Resources  Home prepared for Child (including basic supplies)  Supportive family/friends   Strength comment:    IV  RISK FACTORS AND CURRENT PROBLEMS Current Problem:  YES   Risk Factor & Current Problem Patient Issue Family Issue Risk Factor / Current Problem Comment  Substance Abuse Y N Hx of MJ use    V  SOCIAL WORK ASSESSMENT CSW met with pt to assess history of MJ use.  Pt admits to smoking MJ "once in a blue moon" estimating she smoked about once a week.  Once pregnancy was confirmed, she stopped for a short period but started smoking again to help with appetite.  Pt continued to smoke but decreased use to "once a month" until last month.  CSW explained hospital drug testing policy & pt verbalized an understanding.  UDS & meconium collection  pending.  Pt has all the necessary supplies for the infant & good family support.  FOB at the bedside & appears supportive.  Pt denies any history of depression, anxiety, SI.  Pt appears to be bonding well & appropriate at this time.  CSW will continue to monitor drug screen results & make a referral if needed.      VI SOCIAL WORK PLAN Social Work Plan  No Further Intervention Required / No Barriers to Discharge   Type of pt/family education:   If child protective services report - county:   If child protective services report - date:   Information/referral to community resources comment:   Other social work plan:

## 2013-11-16 NOTE — Progress Notes (Signed)
UR completed 

## 2013-11-16 NOTE — Lactation Note (Signed)
This note was copied from the chart of Virginia Foster SimpsonMarhonda Mills. Lactation Consultation Note  Patient Name: Virginia Foster SimpsonMarhonda Mills ZOXWR'UToday's Date: 11/16/2013 Reason for consult: Initial assessment;Other (Comment) (charting for exclusion)   Maternal Data Formula Feeding for Exclusion: Yes Reason for exclusion: Mother's choice to formula feed on admision  Feeding    LATCH Score/Interventions                      Lactation Tools Discussed/Used     Consult Status Consult Status: Complete    Lynda RainwaterBryant, Leannah Guse [place] 11/16/2013, 5:15 PM

## 2013-11-16 NOTE — Progress Notes (Addendum)
Virginia Mills is a 18 y.o. G2P0010 at 380w1d admitted for rupture of membranes and IUGR  Subjective:  Pt doing well. FB out around 1730 and now on pitocin. +FM. Having recurrent variable decel  Objective: BP 124/66  Pulse 63  Temp(Src) 97.9 F (36.6 C) (Oral)  Resp 18  Ht 5' 6.5" (1.689 m)  Wt 67.132 kg (148 lb)  BMI 23.53 kg/m2  SpO2 99%  LMP 02/02/2013   Total I/O In: -  Out: 800 [Urine:800]  FHT:  FHR: 110s bpm, variability: moderate,  accelerations:  Abscent,  decelerations:  Present variable decels. UC:  Every 2-784min SVE:   Dilation: 7 Effacement (%): 80 Station: 0 Exam by:: Myah Guynes MD  Labs: Lab Results  Component Value Date   WBC 6.7 11/15/2013   HGB 11.0* 11/15/2013   HCT 33.0* 11/15/2013   MCV 81.9 11/15/2013   PLT 160 11/15/2013    Assessment / Plan: admitted for ROM, hx of IUGR (EFW 5%ile at 37 weeks)   Labor: Not tolerating pitocin, no off, amnioinfusion for recurrent variables Fetal Wellbeing:  Category II, reviewed strip with Dr. Shawnie PonsPratt. Making change. Will continue to monitor. Pain Control:  Epidural I/D:  n/a Anticipated MOD:  NSVD  Dollene Mallery RYAN 11/16/2013, 12:37 AM

## 2013-11-16 NOTE — Anesthesia Postprocedure Evaluation (Signed)
  Anesthesia Post-op Note  Patient: Virginia Mills  Procedure(s) Performed: * No procedures listed *  Patient Location: Mother/Baby  Anesthesia Type:Epidural  Level of Consciousness: awake, alert , oriented and patient cooperative  Airway and Oxygen Therapy: Patient Spontanous Breathing  Post-op Pain: mild  Post-op Assessment: Patient's Cardiovascular Status Stable, Respiratory Function Stable, No headache, No backache, No residual numbness and No residual motor weakness  Post-op Vital Signs: stable  Complications: No apparent anesthesia complications

## 2013-11-17 LAB — CBC
HEMATOCRIT: 26.9 % — AB (ref 36.0–46.0)
Hemoglobin: 8.9 g/dL — ABNORMAL LOW (ref 12.0–15.0)
MCH: 27.6 pg (ref 26.0–34.0)
MCHC: 33.1 g/dL (ref 30.0–36.0)
MCV: 83.3 fL (ref 78.0–100.0)
PLATELETS: 111 10*3/uL — AB (ref 150–400)
RBC: 3.23 MIL/uL — ABNORMAL LOW (ref 3.87–5.11)
RDW: 13.4 % (ref 11.5–15.5)
WBC: 13 10*3/uL — AB (ref 4.0–10.5)

## 2013-11-17 MED ORDER — IBUPROFEN 600 MG PO TABS: 600.0000 mg | ORAL_TABLET | Freq: Four times a day (QID) | ORAL | Status: DC | PRN

## 2013-11-17 NOTE — Discharge Summary (Signed)
Obstetric Discharge Summary Reason for Admission: rupture of membranes Prenatal Procedures: NST and ultrasound Intrapartum Procedures: spontaneous vaginal delivery Postpartum Procedures: none Complications-Operative and Postpartum: labial laceration Hemoglobin  Date Value Range Status  11/15/2013 11.0* 12.0 - 15.0 g/dL Final     HCT  Date Value Range Status  11/15/2013 33.0* 36.0 - 46.0 % Final   Pt concerned about bleeding the night, but RN and CNM exams normal.   Physical Exam:  General: alert, cooperative, appears stated age and no distress Lochia: appropriate Uterine Fundus: firm Incision: NA DVT Evaluation: Negative Homan's sign.  Discharge Diagnoses: Term Pregnancy-delivered  Discharge Information: Date: 11/17/2013 Activity: pelvic rest Diet: routine Medications: PNV and Ibuprofen Condition: stable Instructions: refer to practice specific booklet Discharge to: home Follow-up Information   [facility] with [facility] In 4 weeks. (or sooner as needed)    Contact information:   [address] [place] [phone]      [facility]. (As needed in emergencies)    Contact information:   [address] [id number] [place] [id number] [phone]      Newborn Data: Live born female  Birth Weight: 5 lb 7.8 oz (2489 g) APGAR: 7, 9  Home with mother. Check CBC and SW consult prior to D/C.   Virginia KinsmanSMITH, Virginia Mills 11/17/2013, 6:26 AM

## 2013-11-18 NOTE — Discharge Instructions (Signed)
Vaginal Delivery °Care After °Refer to this sheet in the next few weeks. These discharge instructions provide you with information on caring for yourself after delivery. Your caregiver may also give you specific instructions. Your treatment has been planned according to the most current medical practices available, but problems sometimes occur. Call your caregiver if you have any problems or questions after you go home. °HOME CARE INSTRUCTIONS °· Take over-the-counter or prescription medicines only as directed by your caregiver or pharmacist. °· Do not drink alcohol, especially if you are breastfeeding or taking medicine to relieve pain. °· Do not chew or smoke tobacco. °· Do not use illegal drugs. °· Continue to use good perineal care. Good perineal care includes: °· Wiping your perineum from front to back. °· Keeping your perineum clean. °· Do not use tampons or douche until your caregiver says it is okay. °· Shower, wash your hair, and take tub baths as directed by your caregiver. °· Wear a well-fitting bra that provides breast support. °· Eat healthy foods. °· Drink enough fluids to keep your urine clear or pale yellow. °· Eat high-fiber foods such as whole grain cereals and breads, brown rice, beans, and fresh fruits and vegetables every day. These foods may help prevent or relieve constipation. °· Follow your cargiver's recommendations regarding resumption of activities such as climbing stairs, driving, lifting, exercising, or traveling. °· Talk to your caregiver about resuming sexual activities. Resumption of sexual activities is dependent upon your risk of infection, your rate of healing, and your comfort and desire to resume sexual activity. °· Try to have someone help you with your household activities and your newborn for at least a few days after you leave the hospital. °· Rest as much as possible. Try to rest or take a nap when your newborn is sleeping. °· Increase your activities gradually. °· Keep all  of your scheduled postpartum appointments. It is very important to keep your scheduled follow-up appointments. At these appointments, your caregiver will be checking to make sure that you are healing physically and emotionally. °SEEK MEDICAL CARE IF:  °· You are passing large clots from your vagina. Save any clots to show your caregiver. °· You have a foul smelling discharge from your vagina. °· You have trouble urinating. °· You are urinating frequently. °· You have pain when you urinate. °· You have a change in your bowel movements. °· You have increasing redness, pain, or swelling near your vaginal incision (episiotomy) or vaginal tear. °· You have pus draining from your episiotomy or vaginal tear. °· Your episiotomy or vaginal tear is separating. °· You have painful, hard, or reddened breasts. °· You have a severe headache. °· You have blurred vision or see spots. °· You feel sad or depressed. °· You have thoughts of hurting yourself or your newborn. °· You have questions about your care, the care of your newborn, or medicines. °· You are dizzy or lightheaded. °· You have a rash. °· You have nausea or vomiting. °· You were breastfeeding and have not had a menstrual period within 12 weeks after you stopped breastfeeding. °· You are not breastfeeding and have not had a menstrual period by the 12th week after delivery. °· You have a fever. °SEEK IMMEDIATE MEDICAL CARE IF:  °· You have persistent pain. °· You have chest pain. °· You have shortness of breath. °· You faint. °· You have leg pain. °· You have stomach pain. °· Your vaginal bleeding saturates two or more sanitary pads   in 1 hour. MAKE SURE YOU:   Understand these instructions.  Will watch your condition.  Will get help right away if you are not doing well or get worse. Document Released: 10/18/2000 Document Revised: 07/15/2012 Document Reviewed: 06/17/2012 [facility] Patient Information 2014 [place], [place].    Iron-Rich Diet  An iron-rich  diet contains foods that are good sources of iron. Iron is an important mineral that helps your body produce hemoglobin. Hemoglobin is a protein in red blood cells that carries oxygen to the body's tissues. Sometimes, the iron level in your blood can be low. This may be caused by:  A lack of iron in your diet.  Blood loss.  Times of growth, such as during pregnancy or during a child's growth and development. Low levels of iron can cause a decrease in the number of red blood cells. This can result in iron deficiency anemia. Iron deficiency anemia symptoms include:  Tiredness.  Weakness.  Irritability.  Increased chance of infection. Here are some recommendations for daily iron intake:  Males older than 20 years of age need 8 mg of iron per day.  Women ages 5519 to 5950 need 18 mg of iron per day.  Pregnant women need 27 mg of iron per day, and women who are over 319 years of age and breastfeeding need 9 mg of iron per day.  Women over the age of 20 need 8 mg of iron per day. SOURCES OF IRON There are 2 types of iron that are found in food: heme iron and nonheme iron. Heme iron is absorbed by the body better than nonheme iron. Heme iron is found in meat, poultry, and fish. Nonheme iron is found in grains, beans, and vegetables. Heme Iron Sources Food / Iron (mg)  Chicken liver, 3 oz (85 g)/ 10 mg  Beef liver, 3 oz (85 g)/ 5.5 mg  Oysters, 3 oz (85 g)/ 8 mg  Beef, 3 oz (85 g)/ 2 to 3 mg  Shrimp, 3 oz (85 g)/ 2.8 mg  [place]urkey, 3 oz (85 g)/ 2 mg  Chicken, 3 oz (85 g) / 1 mg  Fish (tuna, halibut), 3 oz (85 g)/ 1 mg  Pork, 3 oz (85 g)/ 0.9 mg Nonheme Iron Sources Food / Iron (mg)  Ready-to-eat breakfast cereal, iron-fortified / 3.9 to 7 mg  Tofu,  cup / 3.4 mg  Kidney beans,  cup / 2.6 mg  Baked potato with skin / 2.7 mg  Asparagus,  cup / 2.2 mg  Avocado / 2 mg  Dried peaches,  cup / 1.6 mg  Raisins,  cup / 1.5 mg  Soy milk, 1 cup / 1.5 mg  Whole-wheat  bread, 1 slice / 1.2 mg  Spinach, 1 cup / 0.8 mg  Broccoli,  cup / 0.6 mg IRON ABSORPTION Certain foods can decrease the body's absorption of iron. Try to avoid these foods and beverages while eating meals with iron-containing foods:  Coffee.  Tea.  Fiber.  Soy. Foods containing vitamin C can help increase the amount of iron your body absorbs from iron sources, especially from nonheme sources. Eat foods with vitamin C along with iron-containing foods to increase your iron absorption. Foods that are high in vitamin C include many fruits and vegetables. Some good sources are:  Fresh orange juice.  Oranges.  Strawberries.  Mangoes.  Grapefruit.  Red bell peppers.  Green bell peppers.  Broccoli.  Potatoes with skin.  Tomato juice. Document Released: 06/04/2005 Document Revised: 01/13/2012 Document Reviewed: 04/11/2011  ExitCare® Patient Information ©2014 ExitCare, LLC. ° °

## 2013-11-18 NOTE — Discharge Summary (Signed)
`````  Attestation of Attending Supervision of Advanced Practitioner: Evaluation and management procedures were performed by the PA/NP/CNM/OB Fellow under my supervision/collaboration. Chart reviewed and agree with management and plan.  Tyheem Boughner V 11/18/2013 3:18 PM

## 2013-11-18 NOTE — Discharge Summary (Signed)
Obstetric Discharge Summary Reason for Admission: rupture of membranes Prenatal Procedures: NST and ultrasound Intrapartum Procedures: spontaneous vaginal delivery Postpartum Procedures: none Complications-Operative and Postpartum: labial laceration Hemoglobin  Date Value Range Status  11/17/2013 8.9* 12.0 - 15.0 g/dL Final     DELTA CHECK NOTED     REPEATED TO VERIFY     HCT  Date Value Range Status  11/17/2013 26.9* 36.0 - 46.0 % Final   Pt reports bleeding has slowed significantly and is now similar to a period.  Physical Exam:  General: alert, cooperative, appears stated age and no distress Lochia: appropriate Uterine Fundus: firm Incision: NA DVT Evaluation: Negative Homan's sign.  Discharge Diagnoses: Term Pregnancy-delivered  Discharge Information: Date: 11/18/2013 Activity: pelvic rest Diet: routine Medications: PNV and Ibuprofen Condition: stable Instructions: refer to practice specific booklet Discharge to: home Contraception: Planning for depo Follow-up Information   Follow up with [facility] In 4 weeks. (or sooner as needed)    Contact information:   [address] [place] [phone]      [facility] (As needed in emergencies)    Contact information:   [address] [id number] [place] [id number] [phone]      Newborn Data: Live born female  Birth Weight: 5 lb 7.8 oz (2489 g) APGAR: 7, 9  Home with mother.    Beverely Lowdamo, Elena 11/18/2013, 7:33 AM  I have seen and examined this patient and I agree with the above. Cam HaiSHAW, Raydel Hosick 8:44 AM 11/18/2013

## 2013-11-21 ENCOUNTER — Inpatient Hospital Stay (HOSPITAL_COMMUNITY): Admission: RE | Admit: 2013-11-21 | Payer: Medicaid Other | Source: Ambulatory Visit

## 2013-11-22 NOTE — Discharge Summary (Signed)
Attestation of Attending Supervision of Advanced Practitioner (CNM/NP): Evaluation and management procedures were performed by the Advanced Practitioner under my supervision and collaboration. I have reviewed the Advanced Practitioner's note and chart, and I agree with the management and plan.  Kirk Basquez H. 12:39 PM   

## 2013-12-01 ENCOUNTER — Encounter (HOSPITAL_COMMUNITY): Payer: Self-pay | Admitting: *Deleted

## 2013-12-07 ENCOUNTER — Telehealth: Payer: Self-pay | Admitting: *Deleted

## 2013-12-07 NOTE — Telephone Encounter (Signed)
[facility] c/o frequent HA since delivery on 11/16/13, B/P 90/50, P 100. Spoke with pt encouraged to push fluids and eat frequent meal, appt made for 12/09/13 at 9:45 with Cathie BeamsFran Cresenzo-Dishmon, CNM.

## 2013-12-09 ENCOUNTER — Ambulatory Visit: Payer: Medicaid Other | Admitting: Advanced Practice Midwife

## 2013-12-22 ENCOUNTER — Ambulatory Visit: Payer: Medicaid Other | Admitting: Advanced Practice Midwife

## 2013-12-29 ENCOUNTER — Ambulatory Visit: Payer: Medicaid Other | Admitting: Advanced Practice Midwife

## 2013-12-29 ENCOUNTER — Encounter: Payer: Self-pay | Admitting: *Deleted

## 2014-01-12 ENCOUNTER — Ambulatory Visit (INDEPENDENT_AMBULATORY_CARE_PROVIDER_SITE_OTHER): Payer: Medicaid Other | Admitting: Women's Health

## 2014-01-12 ENCOUNTER — Encounter: Payer: Self-pay | Admitting: Women's Health

## 2014-01-12 VITALS — BP 120/64 | Ht 65.0 in | Wt 126.0 lb

## 2014-01-12 DIAGNOSIS — F53 Postpartum depression: Secondary | ICD-10-CM

## 2014-01-12 DIAGNOSIS — N898 Other specified noninflammatory disorders of vagina: Secondary | ICD-10-CM

## 2014-01-12 DIAGNOSIS — B9689 Other specified bacterial agents as the cause of diseases classified elsewhere: Secondary | ICD-10-CM

## 2014-01-12 DIAGNOSIS — Z3480 Encounter for supervision of other normal pregnancy, unspecified trimester: Secondary | ICD-10-CM

## 2014-01-12 DIAGNOSIS — N760 Acute vaginitis: Secondary | ICD-10-CM

## 2014-01-12 DIAGNOSIS — O2390 Unspecified genitourinary tract infection in pregnancy, unspecified trimester: Secondary | ICD-10-CM

## 2014-01-12 DIAGNOSIS — O99345 Other mental disorders complicating the puerperium: Secondary | ICD-10-CM

## 2014-01-12 LAB — POCT WET PREP (WET MOUNT): CLUE CELLS WET PREP WHIFF POC: POSITIVE

## 2014-01-12 MED ORDER — ESCITALOPRAM OXALATE 10 MG PO TABS: 10.0000 mg | ORAL_TABLET | Freq: Every day | ORAL | Status: DC

## 2014-01-12 MED ORDER — METRONIDAZOLE 500 MG PO TABS: 500.0000 mg | ORAL_TABLET | Freq: Two times a day (BID) | ORAL | Status: DC

## 2014-01-12 NOTE — Addendum Note (Signed)
Addended by: Cheral MarkerBOOKER, Raaga Maeder R on: 01/12/2014 04:17 PM   Modules accepted: Orders

## 2014-01-12 NOTE — Patient Instructions (Signed)

## 2014-01-12 NOTE — Addendum Note (Signed)
Addended by: Cheral MarkerBOOKER, Binh Doten R on: 01/12/2014 04:57 PM   Modules accepted: Orders

## 2014-01-12 NOTE — Progress Notes (Signed)
Patient ID: Virginia Mills, female   DOB: 28-Oct-1996, 18 y.o.   MRN: [id number] Subjective:    Virginia PaulaMarhonda M Griffie is a 18 y.o. [id number] African American female who presents for a postpartum visit. She is 8 weeks postpartum following a spontaneous vaginal delivery at 38 gestational weeks. Anesthesia: epidural. I have fully reviewed the prenatal and intrapartum course. Pt was followed during pregnancy for IUGR. Infant weighed 5lb7oz. Postpartum course has been complicated by PPD. Baby's course has been uncomplicated. Baby is feeding by bottle. Bleeding no bleeding. Bowel function is normal. Bladder function is normal. Patient is sexually active. Last sexual activity: 01/08/14. Contraception method is condoms and wants depo. Postpartum depression screening: positive. Score 21. States she had some problems w/ depression in 6th grade, never went on meds, eventually got better. Denies SI/HI/II.  Last pap n/a d/t age. Malodorous d/c x few days.   The following portions of the patient's history were reviewed and updated as appropriate: allergies, current medications, past medical history, past surgical history and problem list.  Review of Systems Pertinent items are noted in HPI.   Filed Vitals:   01/12/14 1420  BP: 120/64  Height: 5\' 5"  (1.651 m)  Weight: 126 lb (57.153 kg)    Objective:     General:  alert, cooperative and no distress   Breasts:  deferred, no complaints  Lungs: clear to auscultation bilaterally  Heart:  regular rate and rhythm  Abdomen: soft, nontender   Vulva: normal  Vagina: normal vagina, small amount thin frothy d/c, malodorous  Cervix:  closed  Corpus: Well-involuted  Adnexa:  Non-palpable  Rectal Exam: No hemorrhoids        Assessment:   Postpartum exam 8 wks s/p SVD Depression screening PPD BV Contraception counseling   Plan:  Rx metronidazole 500mg  bid x7d Rx lexapro 10mg  daily, #30 w/ 3RF [facility] Contraception: abstinence  until depo on 3/23 Follow up in: 3/23 for urine hcg/depo, then 4wks for f/u on lexapro  Marge DuncansBooker, Rayleigh Gillyard Randall CNM, [facility]NP-BC 01/12/2014 3:05 PM

## 2014-01-13 LAB — GC/CHLAMYDIA PROBE AMP
CT Probe RNA: NEGATIVE
GC Probe RNA: NEGATIVE

## 2014-01-24 ENCOUNTER — Encounter: Payer: Self-pay | Admitting: *Deleted

## 2014-01-24 ENCOUNTER — Other Ambulatory Visit: Payer: Medicaid Other

## 2014-01-24 ENCOUNTER — Ambulatory Visit: Payer: Medicaid Other

## 2014-02-09 ENCOUNTER — Ambulatory Visit: Payer: Medicaid Other | Admitting: Women's Health

## 2014-02-10 ENCOUNTER — Ambulatory Visit: Payer: Self-pay

## 2014-03-14 ENCOUNTER — Emergency Department (HOSPITAL_COMMUNITY): Payer: Medicaid Other

## 2014-03-14 ENCOUNTER — Emergency Department (HOSPITAL_COMMUNITY)
Admission: EM | Admit: 2014-03-14 | Discharge: 2014-03-14 | Disposition: A | Discharge status: Home / Self Care | Payer: Medicaid Other | Attending: Emergency Medicine | Admitting: Emergency Medicine

## 2014-03-14 ENCOUNTER — Encounter (HOSPITAL_COMMUNITY): Payer: Self-pay | Admitting: Emergency Medicine

## 2014-03-14 DIAGNOSIS — O2390 Unspecified genitourinary tract infection in pregnancy, unspecified trimester: Secondary | ICD-10-CM | POA: Insufficient documentation

## 2014-03-14 DIAGNOSIS — N390 Urinary tract infection, site not specified: Secondary | ICD-10-CM | POA: Insufficient documentation

## 2014-03-14 DIAGNOSIS — O2340 Unspecified infection of urinary tract in pregnancy, unspecified trimester: Secondary | ICD-10-CM

## 2014-03-14 DIAGNOSIS — Z79899 Other long term (current) drug therapy: Secondary | ICD-10-CM | POA: Insufficient documentation

## 2014-03-14 DIAGNOSIS — O209 Hemorrhage in early pregnancy, unspecified: Secondary | ICD-10-CM | POA: Insufficient documentation

## 2014-03-14 DIAGNOSIS — O99330 Smoking (tobacco) complicating pregnancy, unspecified trimester: Secondary | ICD-10-CM | POA: Insufficient documentation

## 2014-03-14 LAB — CBC WITH DIFFERENTIAL/PLATELET
Basophils Absolute: 0 10*3/uL (ref 0.0–0.1)
Basophils Relative: 1 % (ref 0–1)
EOS ABS: 0.2 10*3/uL (ref 0.0–0.7)
EOS PCT: 5 % (ref 0–5)
HCT: 37 % (ref 36.0–46.0)
HEMOGLOBIN: 12.5 g/dL (ref 12.0–15.0)
LYMPHS PCT: 27 % (ref 12–46)
Lymphs Abs: 1.3 10*3/uL (ref 0.7–4.0)
MCH: 27.7 pg (ref 26.0–34.0)
MCHC: 33.8 g/dL (ref 30.0–36.0)
MCV: 82 fL (ref 78.0–100.0)
MONO ABS: 0.5 10*3/uL (ref 0.1–1.0)
Monocytes Relative: 11 % (ref 3–12)
Neutro Abs: 2.7 10*3/uL (ref 1.7–7.7)
Neutrophils Relative %: 56 % (ref 43–77)
PLATELETS: 167 10*3/uL (ref 150–400)
RBC: 4.51 MIL/uL (ref 3.87–5.11)
RDW: 14.3 % (ref 11.5–15.5)
WBC: 4.7 10*3/uL (ref 4.0–10.5)

## 2014-03-14 LAB — WET PREP, GENITAL
TRICH WET PREP: NONE SEEN
Yeast Wet Prep HPF POC: NONE SEEN

## 2014-03-14 LAB — PREGNANCY, URINE: PREG TEST UR: POSITIVE — AB

## 2014-03-14 LAB — URINALYSIS, ROUTINE W REFLEX MICROSCOPIC
Bilirubin Urine: NEGATIVE
GLUCOSE, UA: NEGATIVE mg/dL
HGB URINE DIPSTICK: NEGATIVE
Ketones, ur: NEGATIVE mg/dL
Nitrite: NEGATIVE
PH: 5.5 (ref 5.0–8.0)
Protein, ur: NEGATIVE mg/dL
Urobilinogen, UA: 0.2 mg/dL (ref 0.0–1.0)

## 2014-03-14 LAB — URINE MICROSCOPIC-ADD ON

## 2014-03-14 LAB — HCG, QUANTITATIVE, PREGNANCY: HCG, BETA CHAIN, QUANT, S: 2558 m[IU]/mL — AB (ref <5)

## 2014-03-14 MED ORDER — CEPHALEXIN 500 MG PO CAPS: 500.0000 mg | ORAL_CAPSULE | Freq: Three times a day (TID) | ORAL | Status: DC

## 2014-03-14 NOTE — ED Notes (Signed)
nad noted prior to dc. Dc instructions reviewed and explained along with f/u appt with Dr. Emelda FearFerguson.

## 2014-03-14 NOTE — ED Notes (Signed)
Pt reports lower abd pain and vaginal bleeding x 2 days.  Pt says it doesn't look like her regular period.  Says is very watery.  Reports LMP was 4/15.

## 2014-03-14 NOTE — ED Provider Notes (Signed)
CSN: [id number]     Arrival date & time 03/14/14  0820 History   First MD Initiated Contact with Patient 03/14/14 0825     Chief Complaint  Patient presents with  . Vaginal Bleeding  . Abdominal Pain     (Consider location/radiation/quality/duration/timing/severity/associated sxs/prior Treatment) Patient is a 18 y.o. female presenting with abdominal pain. The history is provided by the patient.  Abdominal Pain Pain location:  LLQ, RLQ and suprapubic Pain quality: cramping   Pain radiates to:  Does not radiate Pain severity:  Mild Onset quality:  Gradual Duration:  2 days Timing:  Constant Progression:  Worsening Chronicity:  New Relieved by:  Nothing Worsened by:  Nothing tried Associated symptoms: vaginal bleeding and vaginal discharge   Associated symptoms: no chest pain, no chills, no dysuria, no fever, no nausea and no vomiting    Trey PaulaMarhonda M Yow is a 18 y.o. female who presents to the ED with abdominal pain. LMP 02/16/2014. [id number], she delivered her baby 3 months ago. She did not follow up for her post partum check up. She made another appointment [facility] because he had just had sex and needed to wait 10 days and have a negative pregnancy test. Then her pregnancy Medicaid ran out so she couldn't get the Rx for birth control pills because she would have to pay.  She is on no birth control. She states that she started having abdominal pain 2 days ago that was cramping and then started bleeding after the cramping started. Is is not time for her menses. Last sexual intercourse 2 weeks ago. Current sex partner x 3 years. PMH significant for HSV and GC.  Past Medical History  Diagnosis Date  . Medical history non-contributory    Past Surgical History  Procedure Laterality Date  . Induced abortion     Family History  Problem Relation Age of Onset  . Diabetes Paternal Grandmother   . Hypertension Paternal Grandmother   . Diabetes Paternal Grandfather   .  Hypertension Paternal Grandfather    History  Substance Use Topics  . Smoking status: Current Every Day Smoker    Types: Cigarettes  . Smokeless tobacco: Never Used  . Alcohol Use: Yes     Comment: on weekends   OB History   Grav Para Term Preterm Abortions TAB SAB Ect Mult Living   2 1 1  1 1    1      Review of Systems  Constitutional: Negative for fever and chills.  HENT: Negative.   Eyes: Negative for visual disturbance.  Respiratory: Negative for chest tightness.   Cardiovascular: Negative for chest pain.  Gastrointestinal: Positive for abdominal pain. Negative for nausea and vomiting.  Genitourinary: Positive for urgency, frequency, vaginal bleeding and vaginal discharge. Negative for dysuria and dyspareunia.  Musculoskeletal: Negative for back pain.  Skin: Negative for rash.  Neurological: Negative for light-headedness and headaches.  Psychiatric/Behavioral: Negative for confusion. The patient is not nervous/anxious.       Allergies  Review of patient's allergies indicates no known allergies.  Home Medications   Prior to Admission medications   Medication Sig Start Date End Date Taking? Authorizing Provider  acyclovir (ZOVIRAX) 400 MG tablet Take 1 tablet (400 mg total) by mouth 3 (three) times daily. 10/21/13   Lazaro ArmsLuther H Eure, MD  escitalopram (LEXAPRO) 10 MG tablet Take 1 tablet (10 mg total) by mouth daily. 01/12/14   Marge DuncansKimberly Randall Booker, CNM  ibuprofen (ADVIL,MOTRIN) 600 MG tablet Take 1 tablet (  600 mg total) by mouth every 6 (six) hours as needed for cramping. 11/17/13   Dorathy Kinsman, CNM  metroNIDAZOLE (FLAGYL) 500 MG tablet Take 1 tablet (500 mg total) by mouth 2 (two) times daily. X 7 days 01/12/14   [name], CNM  Pediatric Multiple Vit-C-FA (FLINSTONES GUMMIES OMEGA-3 DHA PO) Take by mouth. Take 2 daily    Historical Provider, MD   BP 122/48  Pulse 69  Temp(Src) 97.5 F (36.4 C) (Oral)  Resp 20  Ht 5\' 5"  (1.651 m)  Wt 120 lb (54.432  kg)  BMI 19.97 kg/m2  SpO2 98%  LMP 02/16/2014 Physical Exam  Nursing note and vitals reviewed. Constitutional: She is oriented to person, place, and time. She appears well-developed and well-nourished. No distress.  HENT:  Head: Normocephalic.  Eyes: EOM are normal.  Neck: Neck supple.  Cardiovascular: Normal rate.   Pulmonary/Chest: Effort normal.  Abdominal: Soft. There is tenderness in the suprapubic area. There is no CVA tenderness.  Tenderness is very mild  Genitourinary:  External genitalia without lesions, frothy discharge vaginal vault, no bleeding noted. Mild CMT, no adnexal tenderness. Uterus without palpable enlargement.   Musculoskeletal: Normal range of motion.  Neurological: She is alert and oriented to person, place, and time. No cranial nerve deficit.  Skin: Skin is warm and dry.  Psychiatric: She has a normal mood and affect. Her behavior is normal.   Results for orders placed during the hospital encounter of 03/14/14 (from the past 24 hour(s))  URINALYSIS, ROUTINE W REFLEX MICROSCOPIC     Status: Abnormal   Collection Time    03/14/14  8:36 AM      Result Value Ref Range   Color, Urine YELLOW  YELLOW   APPearance CLEAR  CLEAR   Specific Gravity, Urine >1.030 (*) 1.005 - 1.030   pH 5.5  5.0 - 8.0   Glucose, UA NEGATIVE  NEGATIVE mg/dL   Hgb urine dipstick NEGATIVE  NEGATIVE   Bilirubin Urine NEGATIVE  NEGATIVE   Ketones, ur NEGATIVE  NEGATIVE mg/dL   Protein, ur NEGATIVE  NEGATIVE mg/dL   Urobilinogen, UA 0.2  0.0 - 1.0 mg/dL   Nitrite NEGATIVE  NEGATIVE   Leukocytes, UA TRACE (*) NEGATIVE  PREGNANCY, URINE     Status: Abnormal   Collection Time    03/14/14  8:36 AM      Result Value Ref Range   Preg Test, Ur POSITIVE (*) NEGATIVE  URINE MICROSCOPIC-ADD ON     Status: None   Collection Time    03/14/14  8:36 AM      Result Value Ref Range   Squamous Epithelial / LPF RARE  RARE   WBC, UA 7-10  <3 WBC/hpf  WET PREP, GENITAL     Status: Abnormal    Collection Time    03/14/14  8:58 AM      Result Value Ref Range   Yeast Wet Prep HPF POC NONE SEEN  NONE SEEN   Trich, Wet Prep NONE SEEN  NONE SEEN   Clue Cells Wet Prep HPF POC FEW (*) NONE SEEN   WBC, Wet Prep HPF POC FEW (*) NONE SEEN  HCG, QUANTITATIVE, PREGNANCY     Status: Abnormal   Collection Time    03/14/14  9:03 AM      Result Value Ref Range   hCG, Beta Chain, Quant, S 2558 (*) <5 mIU/mL  CBC WITH DIFFERENTIAL     Status: None   Collection Time  03/14/14  9:03 AM      Result Value Ref Range   WBC 4.7  4.0 - 10.5 K/uL   RBC 4.51  3.87 - 5.11 MIL/uL   Hemoglobin 12.5  12.0 - 15.0 g/dL   HCT 81.137.0  91.436.0 - 78.246.0 %   MCV 82.0  78.0 - 100.0 fL   MCH 27.7  26.0 - 34.0 pg   MCHC 33.8  30.0 - 36.0 g/dL   RDW 95.614.3  21.311.5 - 08.615.5 %   Platelets 167  150 - 400 K/uL   Neutrophils Relative % 56  43 - 77 %   Neutro Abs 2.7  1.7 - 7.7 K/uL   Lymphocytes Relative 27  12 - 46 %   Lymphs Abs 1.3  0.7 - 4.0 K/uL   Monocytes Relative 11  3 - 12 %   Monocytes Absolute 0.5  0.1 - 1.0 K/uL   Eosinophils Relative 5  0 - 5 %   Eosinophils Absolute 0.2  0.0 - 0.7 K/uL   Basophils Relative 1  0 - 1 %   Basophils Absolute 0.0  0.0 - 0.1 K/uL    Blood type O positive ED Course  Procedures Labs, ultrasound/. Cultures for GC, Chlamydia and urine pending [place] Ob Comp Less 14 Wks  03/14/2014   CLINICAL DATA:  Vaginal bleeding  EXAM: OBSTETRIC <14 WK US AND TRANSVAGINAL OB US  TECHNIQUE: Both transabdominal and transvaginal ultrasound examinations were performed for complete evaluation of the gestation as well as the maternal uterus, adnexal regions, and pelvic cul-de-sac. Transvaginal technique was performed to assess early pregnancy.  COMPARISON:  None.  FINDINGS: Intrauterine gestational sac: Present.  Yolk sac:  No  Embryo:  No  Cardiac Activity: No  Heart Rate:  Not applicable bpm  MSD:  5.7  mm   5 w   2  d              US EDC: 11/12/2014  Maternal uterus/adnexae:  Insert stress that   Subchorionic hemorrhage: No  Right ovary: Contains small complicated cyst measuring 1.9 x 1.3 x 1.9 cm.  Left ovary: Normal  Other :Multiple nabothian cysts noted.  Free fluid:  Small amount of free fluid noted within the pelvis.  IMPRESSION: 1. Probable early intrauterine gestational sac, but no yolk sac, fetal pole, or cardiac activity yet visualized. Recommend follow-up quantitative B-HCG levels and follow-up US in 14 days to confirm and assess viability. This recommendation follows SRU consensus guidelines: Diagnostic Criteria for Nonviable Pregnancy Early in the First Trimester. Malva Limes Engl J Med 2013; 578:4696-29; 369:1443-51. 2. Hemorrhagic cyst or corpus luteal cyst is noted within the right ovary. Attention on follow-up exam recommended. . 3. Small amount of free fluid identified within the pelvis.   Electronically Signed   By: Signa Kellaylor  Stroud M.D.   On: 03/14/2014 10:22   [place] Ob Transvaginal  03/14/2014   CLINICAL DATA:  Vaginal bleeding  EXAM: OBSTETRIC <14 WK US AND TRANSVAGINAL OB US  TECHNIQUE: Both transabdominal and transvaginal ultrasound examinations were performed for complete evaluation of the gestation as well as the maternal uterus, adnexal regions, and pelvic cul-de-sac. Transvaginal technique was performed to assess early pregnancy.  COMPARISON:  None.  FINDINGS: Intrauterine gestational sac: Present.  Yolk sac:  No  Embryo:  No  Cardiac Activity: No  Heart Rate:  Not applicable bpm  MSD:  5.7  mm   5 w   2  d  [place] EDC: 11/12/2014  Maternal uterus/adnexae:  Insert stress that  Subchorionic hemorrhage: No  Right ovary: Contains small complicated cyst measuring 1.9 x 1.3 x 1.9 cm.  Left ovary: Normal  Other :Multiple nabothian cysts noted.  Free fluid:  Small amount of free fluid noted within the pelvis.  IMPRESSION: 1. Probable early intrauterine gestational sac, but no yolk sac, fetal pole, or cardiac activity yet visualized. Recommend follow-up quantitative B-HCG levels and follow-up US in 14 days  to confirm and assess viability. This recommendation follows SRU consensus guidelines: Diagnostic Criteria for Nonviable Pregnancy Early in the First Trimester. Malva Limes Med 2013; 161:0960-45. 2. Hemorrhagic cyst or corpus luteal cyst is noted within the right ovary. Attention on follow-up exam recommended. . 3. Small amount of free fluid identified within the pelvis.   Electronically Signed   By: Signa Kell M.D.   On: 03/14/2014 10:22    MDM  18 y.o. female with lower abdominal pain and h/o vaginal bleeding x 2 days. Patient high risk for ectopic pregnancy with hx of STI's. Discussed with Dr. Emelda Fear report of ultrasound, labs and exam. He will see her for follow up in his office this week. She will return for worsening symptoms. Ectopic precautions. Will treat UTI. I have reviewed this patient's vital signs, nurses notes, appropriate labs and imaging.  I have discussed findings with the patient and plan of care. She voices understanding and agrees to plan.    Medication List    TAKE these medications       cephALEXin 500 MG capsule  Commonly known as:  KEFLEX  Take 1 capsule (500 mg total) by mouth 3 (three) times daily.      ASK your doctor about these medications       acyclovir 400 MG tablet  Commonly known as:  ZOVIRAX  Take 1 tablet (400 mg total) by mouth 3 (three) times daily.              Janne Napoleon, [place] 03/14/14 [phone]

## 2014-03-14 NOTE — ED Provider Notes (Signed)
Medical screening examination/treatment/procedure(s) were conducted as a shared visit with non-physician practitioner(s) and myself.  I personally evaluated the patient during the encounter.   EKG Interpretation None        Joya Gaskinsonald W Marcellous Snarski, MD 03/14/14 [phone]

## 2014-03-14 NOTE — ED Provider Notes (Signed)
Patient seen/examined in the Emergency Department in conjunction with [facility] Patient reports abd pain and vag bleeding.  She is pregnant Exam : awake/alert, no distress, abd soft and nontender Plan: proceed with US to evaluate for IUP   Joya Gaskinsonald W Jehieli Brassell, MD 03/14/14 0900

## 2014-03-14 NOTE — Discharge Instructions (Signed)
Your ultrasound today does not show the pregnancy heart beat. Dr. Emelda FearFerguson wants you to follow up in the office Thursday, May 14 at 3 pm to repeat the ultrasound. Return sooner for any problems.

## 2014-03-15 ENCOUNTER — Telehealth (HOSPITAL_BASED_OUTPATIENT_CLINIC_OR_DEPARTMENT_OTHER): Payer: Self-pay | Admitting: Emergency Medicine

## 2014-03-15 ENCOUNTER — Other Ambulatory Visit: Payer: Self-pay | Admitting: Obstetrics and Gynecology

## 2014-03-15 DIAGNOSIS — O3680X0 Pregnancy with inconclusive fetal viability, not applicable or unspecified: Secondary | ICD-10-CM

## 2014-03-15 LAB — GC/CHLAMYDIA PROBE AMP
CT PROBE, AMP APTIMA: POSITIVE — AB
GC PROBE AMP APTIMA: POSITIVE — AB

## 2014-03-15 LAB — URINE CULTURE: Colony Count: 50000

## 2014-03-15 NOTE — Telephone Encounter (Signed)
+  gonorrhea, +chlamydia. T.O. Order given by Dr Bebe ShaggyWickline for Zithromax one gram PO x one dose and Suprax 400 mg PO x one dose, read back and verified.

## 2014-03-15 NOTE — ED Provider Notes (Signed)
Pt will be contacted by flow manager to arrange for medications for +GC/Chlamydia.  Pt will be ordered zithromax and suprax  Joya Gaskinsonald W Kresta Templeman, MD 03/15/14 1256

## 2014-03-16 ENCOUNTER — Telehealth (HOSPITAL_BASED_OUTPATIENT_CLINIC_OR_DEPARTMENT_OTHER): Payer: Self-pay | Admitting: Emergency Medicine

## 2014-03-17 ENCOUNTER — Other Ambulatory Visit: Payer: Self-pay | Admitting: Obstetrics and Gynecology

## 2014-03-17 ENCOUNTER — Ambulatory Visit (INDEPENDENT_AMBULATORY_CARE_PROVIDER_SITE_OTHER): Payer: Self-pay

## 2014-03-17 DIAGNOSIS — O3680X0 Pregnancy with inconclusive fetal viability, not applicable or unspecified: Secondary | ICD-10-CM

## 2014-03-17 DIAGNOSIS — O09299 Supervision of pregnancy with other poor reproductive or obstetric history, unspecified trimester: Secondary | ICD-10-CM

## 2014-03-17 NOTE — Progress Notes (Signed)
U/S-transvaginal u/s performed, single intrauterine gestational sac noted with +YS noted within, no fetal pole noted, GS measurements c/w 5+4 wks (c/w LMP dates), cx appears long and closed, bilateral adnexa appears wnl with C.L. Noted on RT-1.7cm, no free fluid noted within pelvis, [facility] of [facility] and dates

## 2014-03-20 NOTE — Telephone Encounter (Signed)
Unable to contact patient via phone. Sent letter. °

## 2014-03-23 ENCOUNTER — Encounter: Payer: Self-pay | Admitting: Women's Health

## 2014-04-05 ENCOUNTER — Encounter: Payer: Self-pay | Admitting: Women's Health

## 2014-04-06 ENCOUNTER — Encounter: Payer: Self-pay | Admitting: *Deleted

## 2014-04-22 ENCOUNTER — Encounter: Payer: Self-pay | Admitting: Adult Health

## 2014-04-28 ENCOUNTER — Telehealth (HOSPITAL_BASED_OUTPATIENT_CLINIC_OR_DEPARTMENT_OTHER): Payer: Self-pay | Admitting: Emergency Medicine

## 2014-05-02 ENCOUNTER — Other Ambulatory Visit (HOSPITAL_COMMUNITY): Payer: Self-pay | Admitting: Nurse Practitioner

## 2014-05-02 ENCOUNTER — Ambulatory Visit (HOSPITAL_COMMUNITY): Admit: 2014-05-02 | Payer: Self-pay

## 2014-05-02 ENCOUNTER — Encounter (HOSPITAL_COMMUNITY): Payer: Self-pay | Admitting: Emergency Medicine

## 2014-05-02 ENCOUNTER — Emergency Department (HOSPITAL_COMMUNITY)
Admission: EM | Admit: 2014-05-02 | Discharge: 2014-05-02 | Disposition: A | Discharge status: Home / Self Care | Payer: Medicaid Other | Attending: Emergency Medicine | Admitting: Emergency Medicine

## 2014-05-02 ENCOUNTER — Ambulatory Visit (HOSPITAL_COMMUNITY): Payer: Self-pay

## 2014-05-02 ENCOUNTER — Other Ambulatory Visit (HOSPITAL_COMMUNITY): Payer: Self-pay

## 2014-05-02 DIAGNOSIS — Z332 Encounter for elective termination of pregnancy: Secondary | ICD-10-CM

## 2014-05-02 DIAGNOSIS — O98819 Other maternal infectious and parasitic diseases complicating pregnancy, unspecified trimester: Secondary | ICD-10-CM | POA: Insufficient documentation

## 2014-05-02 DIAGNOSIS — A549 Gonococcal infection, unspecified: Secondary | ICD-10-CM

## 2014-05-02 DIAGNOSIS — Z792 Long term (current) use of antibiotics: Secondary | ICD-10-CM | POA: Insufficient documentation

## 2014-05-02 DIAGNOSIS — N949 Unspecified condition associated with female genital organs and menstrual cycle: Secondary | ICD-10-CM | POA: Insufficient documentation

## 2014-05-02 DIAGNOSIS — A5400 Gonococcal infection of lower genitourinary tract, unspecified: Secondary | ICD-10-CM | POA: Insufficient documentation

## 2014-05-02 DIAGNOSIS — A749 Chlamydial infection, unspecified: Secondary | ICD-10-CM

## 2014-05-02 DIAGNOSIS — Z3490 Encounter for supervision of normal pregnancy, unspecified, unspecified trimester: Secondary | ICD-10-CM

## 2014-05-02 DIAGNOSIS — O039 Complete or unspecified spontaneous abortion without complication: Secondary | ICD-10-CM | POA: Insufficient documentation

## 2014-05-02 DIAGNOSIS — R102 Pelvic and perineal pain: Secondary | ICD-10-CM

## 2014-05-02 DIAGNOSIS — O98219 Gonorrhea complicating pregnancy, unspecified trimester: Secondary | ICD-10-CM | POA: Insufficient documentation

## 2014-05-02 DIAGNOSIS — N939 Abnormal uterine and vaginal bleeding, unspecified: Secondary | ICD-10-CM

## 2014-05-02 DIAGNOSIS — O9989 Other specified diseases and conditions complicating pregnancy, childbirth and the puerperium: Secondary | ICD-10-CM | POA: Insufficient documentation

## 2014-05-02 DIAGNOSIS — O99330 Smoking (tobacco) complicating pregnancy, unspecified trimester: Secondary | ICD-10-CM | POA: Insufficient documentation

## 2014-05-02 DIAGNOSIS — H40009 Preglaucoma, unspecified, unspecified eye: Secondary | ICD-10-CM

## 2014-05-02 DIAGNOSIS — Z79899 Other long term (current) drug therapy: Secondary | ICD-10-CM | POA: Insufficient documentation

## 2014-05-02 LAB — CBC WITH DIFFERENTIAL/PLATELET
Basophils Absolute: 0 10*3/uL (ref 0.0–0.1)
Basophils Relative: 0 % (ref 0–1)
EOS PCT: 1 % (ref 0–5)
Eosinophils Absolute: 0.1 10*3/uL (ref 0.0–0.7)
HEMATOCRIT: 32.7 % — AB (ref 36.0–46.0)
Hemoglobin: 11.1 g/dL — ABNORMAL LOW (ref 12.0–15.0)
LYMPHS PCT: 23 % (ref 12–46)
Lymphs Abs: 2.1 10*3/uL (ref 0.7–4.0)
MCH: 28.2 pg (ref 26.0–34.0)
MCHC: 33.9 g/dL (ref 30.0–36.0)
MCV: 83.2 fL (ref 78.0–100.0)
Monocytes Absolute: 0.6 10*3/uL (ref 0.1–1.0)
Monocytes Relative: 6 % (ref 3–12)
Neutro Abs: 6.5 10*3/uL (ref 1.7–7.7)
Neutrophils Relative %: 70 % (ref 43–77)
Platelets: 151 10*3/uL (ref 150–400)
RBC: 3.93 MIL/uL (ref 3.87–5.11)
RDW: 14 % (ref 11.5–15.5)
WBC: 9.3 10*3/uL (ref 4.0–10.5)

## 2014-05-02 LAB — RPR

## 2014-05-02 LAB — HIV ANTIBODY (ROUTINE TESTING W REFLEX): HIV 1&2 Ab, 4th Generation: NONREACTIVE

## 2014-05-02 MED ORDER — AZITHROMYCIN 250 MG PO TABS
1000.0000 mg | ORAL_TABLET | Freq: Once | ORAL | Status: AC
  Administered 2014-05-02: 1000 mg via ORAL
  Filled 2014-05-02: qty 4

## 2014-05-02 MED ORDER — METRONIDAZOLE 500 MG PO TABS: 500.0000 mg | ORAL_TABLET | Freq: Two times a day (BID) | ORAL | Status: DC

## 2014-05-02 MED ORDER — ONDANSETRON HCL 4 MG/2ML IJ SOLN
4.0000 mg | Freq: Once | INTRAMUSCULAR | Status: AC
  Administered 2014-05-02: 4 mg via INTRAVENOUS
  Filled 2014-05-02: qty 2

## 2014-05-02 MED ORDER — SODIUM CHLORIDE 0.9 % IV SOLN
INTRAVENOUS | Status: AC
  Administered 2014-05-02: 02:00:00 via INTRAVENOUS

## 2014-05-02 MED ORDER — DEXTROSE 5 % IV SOLN
1.0000 g | Freq: Once | INTRAVENOUS | Status: AC
  Administered 2014-05-02: 1 g via INTRAVENOUS
  Filled 2014-05-02: qty 10

## 2014-05-02 MED ORDER — HYDROMORPHONE HCL PF 1 MG/ML IJ SOLN
0.5000 mg | Freq: Once | INTRAMUSCULAR | Status: AC
  Administered 2014-05-02: 0.5 mg via INTRAVENOUS
  Filled 2014-05-02: qty 1

## 2014-05-02 MED ORDER — DOXYCYCLINE HYCLATE 100 MG PO CAPS: 100.0000 mg | ORAL_CAPSULE | Freq: Two times a day (BID) | ORAL | Status: DC

## 2014-05-02 NOTE — ED Provider Notes (Signed)
CSN: [id number]     Arrival date & time 05/02/14  0030 History   First MD Initiated Contact with Patient 05/02/14 0043     Chief Complaint  Patient presents with  . Vaginal Bleeding     (Consider location/radiation/quality/duration/timing/severity/associated sxs/prior Treatment) Patient is a 18 y.o. female presenting with vaginal bleeding. The history is provided by the patient.  Vaginal Bleeding Quality:  Clots and bright red Severity:  Moderate Onset quality:  Gradual Duration:  4 days Progression:  Worsening Chronicity:  New Number of pads used:  8 pads today Relieved by:  None tried Worsened by:  Nothing tried Ineffective treatments:  None tried Associated symptoms: abdominal pain and vaginal discharge   Associated symptoms: no nausea   Risk factors: STD and terminated pregnancy    Virginia Mills is a 18 y.o. female who presents to the ED with vaginal bleeding. [id number]  She had a EAB @ [redacted] weeks gestation 4 days ago. She has had some bleeding but today the pain and bleeding got much worse. She is passing clots. Patient was here in May and tested positive for GC and Chlamydia and was never treated. The flow manager was never able to get in touch with the patient per notes in the chart. Patient states she was not aware of the positive cultures.  Past Medical History  Diagnosis Date  . Medical history non-contributory    Past Surgical History  Procedure Laterality Date  . Induced abortion    . Induced abortion     Family History  Problem Relation Age of Onset  . Diabetes Paternal Grandmother   . Hypertension Paternal Grandmother   . Diabetes Paternal Grandfather   . Hypertension Paternal Grandfather    History  Substance Use Topics  . Smoking status: Current Every Day Smoker    Types: Cigarettes  . Smokeless tobacco: Never Used  . Alcohol Use: Yes     Comment: on weekends   OB History   Grav Para Term Preterm Abortions TAB SAB Ect Mult Living   3 1 1  1 1    1       Review of Systems  Gastrointestinal: Positive for abdominal pain. Negative for nausea and vomiting.  Genitourinary: Positive for vaginal bleeding, vaginal discharge and pelvic pain.  All other systems negative.    Allergies  Review of patient's allergies indicates no known allergies.  Home Medications   Prior to Admission medications   Medication Sig Start Date End Date Taking? Authorizing Provider  acyclovir (ZOVIRAX) 400 MG tablet Take 1 tablet (400 mg total) by mouth 3 (three) times daily. 10/21/13  Yes Lazaro ArmsLuther H Eure, MD  amoxicillin (AMOXIL) 500 MG capsule Take 500 mg by mouth 3 (three) times daily.   Yes Historical Provider, MD  cephALEXin (KEFLEX) 500 MG capsule Take 1 capsule (500 mg total) by mouth 3 (three) times daily. 03/14/14   Aminah Zabawa Orlene OchM Daizha Anand, NP   BP 120/64  Pulse 76  Temp(Src) 98.3 F (36.8 C) (Oral)  Resp 16  Ht 5\' 5"  (1.651 m)  Wt 145 lb (65.772 kg)  BMI 24.13 kg/m2  SpO2 100%  LMP 02/07/2014 Physical Exam  Nursing note and vitals reviewed. Constitutional: She is oriented to person, place, and time. She appears well-developed and well-nourished. No distress.  HENT:  Head: Normocephalic.  Eyes: EOM are normal.  Neck: Neck supple.  Cardiovascular: Normal rate.   Pulmonary/Chest: Effort normal and breath sounds normal.  Abdominal: Soft. Bowel sounds are normal. There is tenderness  in the right lower quadrant, suprapubic area and left lower quadrant. There is no rebound, no guarding and no CVA tenderness.  Genitourinary:  External genitalia without lesions. Moderate blood vaginal vault. Positive CMT, bilateral adnexal tenderness. Uterus upper limits normal.   Musculoskeletal: Normal range of motion.  Neurological: She is alert and oriented to person, place, and time. No cranial nerve deficit.  Skin: Skin is warm and dry.  Psychiatric: She has a normal mood and affect. Her behavior is normal.   Results for orders placed during the hospital encounter of  05/02/14 (from the past 24 hour(s))  CBC WITH DIFFERENTIAL     Status: Abnormal   Collection Time    05/02/14  1:44 AM      Result Value Ref Range   WBC 9.3  4.0 - 10.5 K/uL   RBC 3.93  3.87 - 5.11 MIL/uL   Hemoglobin 11.1 (*) 12.0 - 15.0 g/dL   HCT 54.032.7 (*) 98.136.0 - 19.146.0 %   MCV 83.2  78.0 - 100.0 fL   MCH 28.2  26.0 - 34.0 pg   MCHC 33.9  30.0 - 36.0 g/dL   RDW 47.814.0  29.511.5 - 62.115.5 %   Platelets 151  150 - 400 K/uL   Neutrophils Relative % 70  43 - 77 %   Neutro Abs 6.5  1.7 - 7.7 K/uL   Lymphocytes Relative 23  12 - 46 %   Lymphs Abs 2.1  0.7 - 4.0 K/uL   Monocytes Relative 6  3 - 12 %   Monocytes Absolute 0.6  0.1 - 1.0 K/uL   Eosinophils Relative 1  0 - 5 %   Eosinophils Absolute 0.1  0.0 - 0.7 K/uL   Basophils Relative 0  0 - 1 %   Basophils Absolute 0.0  0.0 - 0.1 K/uL    ED Course: Discussed with Dr. Lenn SinkHarrway-Smith on call for GYN and she suggest treating the patient for PID as out patient.   Procedures  IV hydration, Zithromax 1 gram PO, Rocephin 1 gram IV, pain management.  @0245  patient states she is feeling much better, minimal bleeding and pain.  MDM  18 y.o. female with cultures positive for GC and Chlamydia 03/14/2014 and not treated. Patient treated tonight and will continue out patient treatment. Will have her return in the morning for ultrasound to evaluated pelvic pain and bleeding.  Discussed with the patient and all questioned fully answered.   Medication List    TAKE these medications       doxycycline 100 MG capsule  Commonly known as:  VIBRAMYCIN  Take 1 capsule (100 mg total) by mouth 2 (two) times daily.     metroNIDAZOLE 500 MG tablet  Commonly known as:  FLAGYL  Take 1 tablet (500 mg total) by mouth 2 (two) times daily.      ASK your doctor about these medications       acyclovir 400 MG tablet  Commonly known as:  ZOVIRAX  Take 1 tablet (400 mg total) by mouth 3 (three) times daily.     amoxicillin 500 MG capsule  Commonly known as:  AMOXIL   Take 500 mg by mouth 3 (three) times daily.     cephALEXin 500 MG capsule  Commonly known as:  KEFLEX  Take 1 capsule (500 mg total) by mouth 3 (three) times daily.          [address]Hope HallsboroM Ahmari Garton, [place] 05/02/14 [phone]

## 2014-05-02 NOTE — ED Notes (Signed)
Pt was [redacted] weeks pregnant and had an elective abortion on Thursday, states she started passing large clots yesterday and her abd cramping has worsened today.

## 2014-05-02 NOTE — ED Notes (Signed)
Pt alert & oriented x4, stable gait. Patient given discharge instructions, paperwork & prescription(s). Patient  instructed to stop at the registration desk to finish any additional paperwork. Patient verbalized understanding. Pt left department w/ no further questions. 

## 2014-05-02 NOTE — ED Provider Notes (Signed)
Medical screening examination/treatment/procedure(s) were performed by non-physician practitioner and as supervising physician I was immediately available for consultation/collaboration.   Dione Boozeavid Glick, MD 05/02/14 [phone]

## 2014-05-02 NOTE — ED Notes (Signed)
Pt c/o vaginal bleeding x4 days. Pt sts the blood is "bright red and dark red" pt also c/o lower abd pain. Pt sts she had a elective abortion on Thursday and now having pain and bleeding. Family member at bedside at this time. No needs voiced.

## 2014-05-04 ENCOUNTER — Encounter: Payer: Self-pay | Admitting: Women's Health

## 2014-09-05 ENCOUNTER — Encounter (HOSPITAL_COMMUNITY): Payer: Self-pay | Admitting: Emergency Medicine

## 2014-12-14 ENCOUNTER — Encounter (HOSPITAL_COMMUNITY): Payer: Self-pay | Admitting: *Deleted

## 2014-12-14 ENCOUNTER — Emergency Department (HOSPITAL_COMMUNITY)
Admission: EM | Admit: 2014-12-14 | Discharge: 2014-12-14 | Disposition: A | Discharge status: Home / Self Care | Payer: Medicaid Other | Attending: Emergency Medicine | Admitting: Emergency Medicine

## 2014-12-14 DIAGNOSIS — Z79899 Other long term (current) drug therapy: Secondary | ICD-10-CM | POA: Insufficient documentation

## 2014-12-14 DIAGNOSIS — Z792 Long term (current) use of antibiotics: Secondary | ICD-10-CM | POA: Insufficient documentation

## 2014-12-14 DIAGNOSIS — B349 Viral infection, unspecified: Secondary | ICD-10-CM

## 2014-12-14 DIAGNOSIS — Z720 Tobacco use: Secondary | ICD-10-CM | POA: Insufficient documentation

## 2014-12-14 DIAGNOSIS — M791 Myalgia: Secondary | ICD-10-CM | POA: Insufficient documentation

## 2014-12-14 MED ORDER — IBUPROFEN 600 MG PO TABS: 600.0000 mg | ORAL_TABLET | Freq: Four times a day (QID) | ORAL | Status: DC | PRN

## 2014-12-14 MED ORDER — MAGIC MOUTHWASH W/LIDOCAINE: 5.0000 mL | Freq: Three times a day (TID) | ORAL | Status: DC | PRN

## 2014-12-14 MED ORDER — PSEUDOEPHEDRINE-GUAIFENESIN ER 60-600 MG PO TB12: 1.0000 | ORAL_TABLET | Freq: Two times a day (BID) | ORAL | Status: DC

## 2014-12-14 NOTE — ED Notes (Signed)
Pt states generalized body aches, headache, sore throat and pain to the nose. Symptoms x 2 days. Productive cough, dark yellow-oragne in color, per pt.

## 2014-12-14 NOTE — ED Provider Notes (Signed)
CSN: [id number]     Arrival date & time 12/14/14  0735 History   First MD Initiated Contact with Patient 12/14/14 0809     Chief Complaint  Patient presents with  . Generalized Body Aches     (Consider location/radiation/quality/duration/timing/severity/associated sxs/prior Treatment) HPI   Virginia Mills is a 19 y.o. female who presents to the Emergency Department complaining of generalized body aches, sore throat, frontal headache and cough for 2 days.  She reports sudden onset of symptoms.  She states her cough is productive of dark sputum and she reports that she was  unable to sleep last evening due to her symptoms.  She took Nyquil without relief.  She denies shortness of breath, abdominal pain, fever or vomiting.     Past Medical History  Diagnosis Date  . Medical history non-contributory    Past Surgical History  Procedure Laterality Date  . Induced abortion    . Induced abortion     Family History  Problem Relation Age of Onset  . Diabetes Paternal Grandmother   . Hypertension Paternal Grandmother   . Diabetes Paternal Grandfather   . Hypertension Paternal Grandfather    History  Substance Use Topics  . Smoking status: Current Every Day Smoker    Types: Cigarettes  . Smokeless tobacco: Never Used  . Alcohol Use: Yes     Comment: on weekends   OB History    Gravida Para Term Preterm AB TAB SAB Ectopic Multiple Living   3 1 1  1 1    1      Review of Systems  Constitutional: Negative for fever, chills, activity change and appetite change.  HENT: Positive for congestion, rhinorrhea, sinus pressure and sore throat. Negative for facial swelling and trouble swallowing.   Eyes: Negative for visual disturbance.  Respiratory: Positive for cough. Negative for shortness of breath, wheezing and stridor.   Gastrointestinal: Negative for nausea and vomiting.  Musculoskeletal: Positive for myalgias. Negative for neck pain and neck stiffness.  Skin: Negative.  Negative  for rash.  Neurological: Positive for headaches. Negative for dizziness, weakness and numbness.  Hematological: Negative for adenopathy.  Psychiatric/Behavioral: Negative for confusion.  All other systems reviewed and are negative.     Allergies  Review of patient's allergies indicates no known allergies.  Home Medications   Prior to Admission medications   Medication Sig Start Date End Date Taking? Authorizing Provider  acyclovir (ZOVIRAX) 400 MG tablet Take 1 tablet (400 mg total) by mouth 3 (three) times daily. 10/21/13   Lazaro ArmsLuther H Eure, MD  amoxicillin (AMOXIL) 500 MG capsule Take 500 mg by mouth 3 (three) times daily.    Historical Provider, MD  cephALEXin (KEFLEX) 500 MG capsule Take 1 capsule (500 mg total) by mouth 3 (three) times daily. 03/14/14   Hope Orlene OchM Neese, NP  doxycycline (VIBRAMYCIN) 100 MG capsule Take 1 capsule (100 mg total) by mouth 2 (two) times daily. 05/02/14   Hope Orlene OchM Neese, NP  metroNIDAZOLE (FLAGYL) 500 MG tablet Take 1 tablet (500 mg total) by mouth 2 (two) times daily. 05/02/14   Hope Orlene OchM Neese, NP   BP 122/83 mmHg  Pulse 69  Temp(Src) 97.9 F (36.6 C) (Oral)  Resp 18  Ht 5\' 5"  (1.651 m)  Wt 130 lb (58.968 kg)  BMI 21.63 kg/m2  SpO2 100%  LMP 12/14/2013  Breastfeeding? Unknown Physical Exam  Constitutional: She is oriented to person, place, and time. She appears well-developed and well-nourished. No distress.  HENT:  Head:  Normocephalic and atraumatic.  Right Ear: Tympanic membrane and ear canal normal.  Left Ear: Tympanic membrane and ear canal normal.  Nose: Mucosal edema and rhinorrhea present.  Mouth/Throat: Uvula is midline and mucous membranes are normal. No trismus in the jaw. No uvula swelling. Posterior oropharyngeal erythema present. No oropharyngeal exudate, posterior oropharyngeal edema or tonsillar abscesses.  Eyes: Conjunctivae are normal.  Neck: Normal range of motion and phonation normal. Neck supple. No Brudzinski's sign and no Kernig's  sign noted.  Cardiovascular: Normal rate, regular rhythm, normal heart sounds and intact distal pulses.   No murmur heard. Pulmonary/Chest: Effort normal and breath sounds normal. No respiratory distress. She has no wheezes. She has no rales.  Abdominal: Soft. She exhibits no distension. There is no tenderness. There is no rebound and no guarding.  Musculoskeletal: Normal range of motion. She exhibits no edema.  Lymphadenopathy:    She has no cervical adenopathy.  Neurological: She is alert and oriented to person, place, and time. She exhibits normal muscle tone. Coordination normal.  Skin: Skin is warm and dry. No rash noted.  Psychiatric: She has a normal mood and affect.  Nursing note and vitals reviewed.   ED Course  Procedures (including critical care time) Labs Review Labs Reviewed - No data to display  Imaging Review No results found.   EKG Interpretation None      MDM   Final diagnoses:  Viral illness    Pt is well appearing, non-toxic.  Sx's are likely viral.  Patient agrees to rest, fluids and close PMD f/u if needed.  Vitals stable.  Pt agrees to plan.      Trayon Krantz L. Rowe Robert 12/15/14 2121  Vanetta Mulders, MD 12/16/14 1043

## 2014-12-14 NOTE — Discharge Instructions (Signed)
Cough, Adult ° A cough is a reflex. It helps you clear your throat and airways. A cough can help heal your body. A cough can last 2 or 3 weeks (acute) or may last more than 8 weeks (chronic). Some common causes of a cough can include an infection, allergy, or a cold. °HOME CARE °· Only take medicine as told by your doctor. °· If given, take your medicines (antibiotics) as told. Finish them even if you start to feel better. °· Use a cold steam vaporizer or humidifier in your home. This can help loosen thick spit (secretions). °· Sleep so you are almost sitting up (semi-upright). Use pillows to do this. This helps reduce coughing. °· Rest as needed. °· Stop smoking if you smoke. °GET HELP RIGHT AWAY IF: °· You have yellowish-white fluid (pus) in your thick spit. °· Your cough gets worse. °· Your medicine does not reduce coughing, and you are losing sleep. °· You cough up blood. °· You have trouble breathing. °· Your pain gets worse and medicine does not help. °· You have a fever. °MAKE SURE YOU:  °· Understand these instructions. °· Will watch your condition. °· Will get help right away if you are not doing well or get worse. °Document Released: 07/04/2011 Document Revised: 03/07/2014 Document Reviewed: 07/04/2011 °ExitCare® Patient Information ©2015 [employer]. This information is not intended to replace advice given to you by your health care provider. Make sure you discuss any questions you have with your health care provider. ° °Viral Infections °A virus is a type of germ. Viruses can cause: °· Minor sore throats. °· Aches and pains. °· Headaches. °· Runny nose. °· Rashes. °· Watery eyes. °· Tiredness. °· Coughs. °· Loss of appetite. °· Feeling sick to your stomach (nausea). °· Throwing up (vomiting). °· Watery poop (diarrhea). °HOME CARE  °· Only take medicines as told by your doctor. °· Drink enough water and fluids to keep your pee (urine) clear or pale yellow. Sports drinks are a good choice. °· Get plenty  of rest and eat healthy. Soups and broths with crackers or rice are fine. °GET HELP RIGHT AWAY IF:  °· You have a very bad headache. °· You have shortness of breath. °· You have chest pain or neck pain. °· You have an unusual rash. °· You cannot stop throwing up. °· You have watery poop that does not stop. °· You cannot keep fluids down. °· You or your child has a temperature by mouth above 102° F (38.9° C), not controlled by medicine. °· Your baby is older than 3 months with a rectal temperature of 102° F (38.9° C) or higher. °· Your baby is 3 months old or younger with a rectal temperature of 100.4° F (38° C) or higher. °MAKE SURE YOU:  °· Understand these instructions. °· Will watch this condition. °· Will get help right away if you are not doing well or get worse. °Document Released: 10/03/2008 Document Revised: 01/13/2012 Document Reviewed: 02/26/2011 °ExitCare® Patient Information ©2015 [employer]. This information is not intended to replace advice given to you by your health care provider. Make sure you discuss any questions you have with your health care provider. ° °

## 2015-03-19 ENCOUNTER — Emergency Department (HOSPITAL_COMMUNITY): Payer: Self-pay

## 2015-03-19 ENCOUNTER — Encounter (HOSPITAL_COMMUNITY): Payer: Self-pay | Admitting: Emergency Medicine

## 2015-03-19 ENCOUNTER — Emergency Department (HOSPITAL_COMMUNITY)
Admission: EM | Admit: 2015-03-19 | Discharge: 2015-03-19 | Discharge status: Against Medical Advice | Payer: Medicaid Other | Attending: Emergency Medicine | Admitting: Emergency Medicine

## 2015-03-19 DIAGNOSIS — S01312A Laceration without foreign body of left ear, initial encounter: Secondary | ICD-10-CM | POA: Insufficient documentation

## 2015-03-19 DIAGNOSIS — Y9389 Activity, other specified: Secondary | ICD-10-CM | POA: Insufficient documentation

## 2015-03-19 DIAGNOSIS — Y998 Other external cause status: Secondary | ICD-10-CM | POA: Insufficient documentation

## 2015-03-19 DIAGNOSIS — Z720 Tobacco use: Secondary | ICD-10-CM | POA: Insufficient documentation

## 2015-03-19 DIAGNOSIS — S0511XA Contusion of eyeball and orbital tissues, right eye, initial encounter: Secondary | ICD-10-CM

## 2015-03-19 DIAGNOSIS — S0181XA Laceration without foreign body of other part of head, initial encounter: Secondary | ICD-10-CM

## 2015-03-19 DIAGNOSIS — Y9289 Other specified places as the place of occurrence of the external cause: Secondary | ICD-10-CM | POA: Insufficient documentation

## 2015-03-19 DIAGNOSIS — R6884 Jaw pain: Secondary | ICD-10-CM

## 2015-03-19 DIAGNOSIS — S0101XA Laceration without foreign body of scalp, initial encounter: Secondary | ICD-10-CM

## 2015-03-19 MED ORDER — ACETAMINOPHEN 500 MG PO TABS
1000.0000 mg | ORAL_TABLET | Freq: Once | ORAL | Status: AC
  Administered 2015-03-19: 1000 mg via ORAL
  Filled 2015-03-19: qty 2

## 2015-03-19 MED ORDER — IBUPROFEN 800 MG PO TABS
800.0000 mg | ORAL_TABLET | Freq: Once | ORAL | Status: AC
  Administered 2015-03-19: 800 mg via ORAL
  Filled 2015-03-19: qty 1
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
## 2016-06-17 ENCOUNTER — Emergency Department (HOSPITAL_COMMUNITY)
Admission: EM | Admit: 2016-06-17 | Discharge: 2016-06-17 | Disposition: A | Discharge status: Home / Self Care | Payer: Self-pay | Attending: Emergency Medicine | Admitting: Emergency Medicine

## 2016-06-17 ENCOUNTER — Emergency Department (HOSPITAL_COMMUNITY): Payer: Self-pay

## 2016-06-17 ENCOUNTER — Encounter (HOSPITAL_COMMUNITY): Payer: Self-pay | Admitting: Emergency Medicine

## 2016-06-17 DIAGNOSIS — Z79899 Other long term (current) drug therapy: Secondary | ICD-10-CM | POA: Insufficient documentation

## 2016-06-17 DIAGNOSIS — Z791 Long term (current) use of non-steroidal anti-inflammatories (NSAID): Secondary | ICD-10-CM | POA: Insufficient documentation

## 2016-06-17 DIAGNOSIS — G510 Bell's palsy: Secondary | ICD-10-CM | POA: Insufficient documentation

## 2016-06-17 DIAGNOSIS — F17210 Nicotine dependence, cigarettes, uncomplicated: Secondary | ICD-10-CM | POA: Insufficient documentation

## 2016-06-17 MED ORDER — PREDNISONE 20 MG PO TABS: 60.0000 mg | ORAL_TABLET | Freq: Every day | ORAL | 0 refills | Status: AC

## 2016-06-17 NOTE — ED Triage Notes (Signed)
Pt reports 2 days ago she wasn't able to taste anything with the left side of her tongue.  Last night she noticed numbness to left side of face and left sided facial paralysis.  Pt can't blink left eye, eye watering. Denies other symptoms, denies weakness or numbness in any extremities Notified EDP. Not a code stroke per Dr. Estell HarpinZammit.  CT head ordered.  PT made aware of treatment plan.

## 2016-06-17 NOTE — ED Notes (Signed)
Patient with no complaints at this time. Respirations even and unlabored. Skin warm/dry. Discharge instructions reviewed with patient at this time. Patient given opportunity to voice concerns/ask questions. Patient discharged at this time and left Emergency Department with steady gait.   

## 2016-06-17 NOTE — ED Provider Notes (Signed)
Emergency Department Provider Note   I have reviewed the triage vital signs and the nursing notes.   HISTORY  Chief Complaint Other (Left sided facial paryalysis )   HPI Virginia Mills is a 20 y.o. female with no PMH presents to the emergency department for evaluation of acute onset left face paralysis and decreased taste sensation of the left side of her tongue. She has some mild left-sided face pain. Patient noted pain and left-sided her face 2 days ago and this morning noticed that she had facial asymmetry and weakness. No sensory deficit. She notes she has difficulty closing her left eye. No known tick bites or obvious exposure. She has no pain in her ear. No associated headache. She is not weak or numb in any other part of her body. No difficulty walking. No fevers. Symptoms are constant with no exacerbating or alleviating factors.    Past Medical History:  Diagnosis Date  . Medical history non-contributory     Patient Active Problem List   Diagnosis Date Noted  . Vaginal delivery 11/17/2013  . Active labor 11/15/2013  . 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 IUGR (intrauterine growth restriction) 11/08/2013  . URI (upper respiratory infection) 06/30/2013  . Marijuana use 04/24/2013  . Supervision of other normal pregnancy 04/20/2013    Past Surgical History:  Procedure Laterality Date  . INDUCED ABORTION    . INDUCED ABORTION      Current Outpatient Rx  . Order #: [id number] Class: Normal  . Order #: [id number] Class: Print  . Order #: [id number] Class: Print  . Order #: [id number] Class: Print  . Order #: [id number] Class: Historical Med  . Order #: [id number] Class: Print  . Order #: [id number] Class: Print  . Order #: [id number] Class: Print  . Order #: [id number] Class: Print    Allergies Review of patient's allergies indicates no known allergies.  Family History  Problem Relation Age of Onset  . Diabetes Paternal Grandmother   . Hypertension Paternal Grandmother   .  Diabetes Paternal Grandfather   . Hypertension Paternal Grandfather     Social History Social History  Substance Use Topics  . Smoking status: Current Every Day Smoker    Packs/day: 0.50    Types: Cigarettes  . Smokeless tobacco: Never Used  . Alcohol use Yes     Comment: on weekends    Review of Systems  Constitutional: No fever/chills Eyes: No visual changes. ENT: No sore throat. Cardiovascular: Denies chest pain. Respiratory: Denies shortness of breath. Gastrointestinal: No abdominal pain.  No nausea, no vomiting.  No diarrhea.  No constipation. Genitourinary: Negative for dysuria. Musculoskeletal: Negative for back pain. Skin: Negative for rash. Neurological: Negative for headaches or numbness. Positive face weakness.   10-point ROS otherwise negative.  ____________________________________________   PHYSICAL EXAM:  VITAL SIGNS: ED Triage Vitals  Enc Vitals Group     BP 06/17/16 1343 98/63     Pulse Rate 06/17/16 1343 60     Resp 06/17/16 1343 17     Temp 06/17/16 1343 98.3 F (36.8 C)     Temp Source 06/17/16 1343 Oral     SpO2 06/17/16 1343 91 %     Weight 06/17/16 1344 125 lb (56.7 kg)     Height 06/17/16 1344 5\' 5"  (1.651 m)   Constitutional: Alert and oriented. Well appearing and in no acute distress. Eyes: Conjunctivae are normal. PERRL. EOMI. Incomplete closure of left eye.  Head: Atraumatic. Nose: No congestion/rhinnorhea. Mouth/Throat: Mucous membranes are moist.  Oropharynx non-erythematous. Neck: No stridor.  No meningeal signs.   Cardiovascular: Normal rate, regular rhythm. Good peripheral circulation. Grossly normal heart sounds.   Respiratory: Normal respiratory effort.  No retractions. Lungs CTAB. Gastrointestinal: Soft and nontender. No distention.  Musculoskeletal: No lower extremity tenderness nor edema. No gross deformities of extremities. Neurologic:  Normal speech and language. Patient with complete left face paralysis. No forehead  sparing. Normal face sensation. No other neurological deficits.  Skin:  Skin is warm, dry and intact. No rash noted. Psychiatric: Mood and affect are normal. Speech and behavior are normal.  ____________________________________________   LABS (all labs ordered are listed, but only abnormal results are displayed)  None  ____________________________________________  RADIOLOGY  Ct Head Wo Contrast  Result Date: 06/17/2016 CLINICAL DATA:  Three-day history of left-sided facial numbness and loss of taste sensation EXAM: CT HEAD WITHOUT CONTRAST TECHNIQUE: Contiguous axial images were obtained from the base of the skull through the vertex without intravenous contrast. COMPARISON:  Mar 04, 2010 FINDINGS: Brain: The ventricles are normal in size and configuration. There is no intracranial mass, hemorrhage, extra-axial fluid collection, or midline shift. Gray-white compartments appear normal. No acute infarct evident. Vascular: There is no hyperdense vessel. No arterial vascular calcification evident. Skull: Bony calvarium appears intact. Sinuses/Orbits: There is incomplete visualization of an apparent retention cyst in the posterior left maxillary antrum. Other visualized paranasal sinuses are clear. Frontal sinuses are essentially aplastic. Visualized orbits appear symmetric bilaterally. Other: Mastoid air cells clear. IMPRESSION: Mild left maxillary sinus disease.  Study otherwise unremarkable. Electronically Signed   By: Bretta BangWilliam  Woodruff III M.D.   On: 06/17/2016 13:54    ____________________________________________   PROCEDURES  Procedure(s) performed:   Procedures  None ____________________________________________   INITIAL IMPRESSION / ASSESSMENT AND PLAN / ED COURSE  Pertinent labs & imaging results that were available during my care of the patient were reviewed by me and considered in my medical decision making (see chart for details).  Patient resents to the emergency  department for evaluation of left-sided headache weakness. No associated numbness. Symptoms are consistent with Bell's palsy. CT scan shows left mild sinusitis. No other neurological deficits on exam. Discussed the importance of keeping her eye moist with over-the-counter drops and taping it shut at night. We'll discharge home with course of steroids and primary care physician follow-up.   At this time, I do not feel there is any life-threatening condition present. I have reviewed and discussed all results (EKG, imaging, lab, urine as appropriate), exam findings with patient. I have reviewed nursing notes and appropriate previous records.  I feel the patient is safe to be discharged home without further emergent workup. Discussed usual and customary return precautions. Patient and family (if present) verbalize understanding and are comfortable with this plan.  Patient will follow-up with their primary care provider. If they do not have a primary care provider, information for follow-up has been provided to them. All questions have been answered.   ____________________________________________  FINAL CLINICAL IMPRESSION(S) / ED DIAGNOSES  Final diagnoses:  Bell's palsy     MEDICATIONS GIVEN DURING THIS VISIT:  None  NEW OUTPATIENT MEDICATIONS STARTED DURING THIS VISIT:  New Prescriptions   PREDNISONE (DELTASONE) 20 MG TABLET    Take 3 tablets (60 mg total) by mouth daily.      Note:  This document was prepared using Dragon voice recognition software and may include unintentional dictation errors.  Alona BeneJoshua Long, MD Emergency Medicine   Maia PlanJoshua G Long, MD 06/17/16 [phone]

## 2016-06-17 NOTE — Discharge Instructions (Signed)
You were seen in the ED today with Bell's Palsy. This is a condition that can look like a stroke but is not. You will need to keep you left eye moist with over the counter eye drops and tape it shut at night. Take Tylenol and Motrin for pain.   Follow up with your PCP as needed in the coming week or return to the ED immediately if you develop any fever, chills, or weakness/numbness in other part of your body.

## 2016-06-26 ENCOUNTER — Emergency Department (HOSPITAL_COMMUNITY)
Admission: EM | Admit: 2016-06-26 | Discharge: 2016-06-26 | Disposition: A | Discharge status: Home / Self Care | Payer: Self-pay | Attending: Emergency Medicine | Admitting: Emergency Medicine

## 2016-06-26 ENCOUNTER — Encounter (HOSPITAL_COMMUNITY): Payer: Self-pay | Admitting: Emergency Medicine

## 2016-06-26 DIAGNOSIS — F17210 Nicotine dependence, cigarettes, uncomplicated: Secondary | ICD-10-CM | POA: Insufficient documentation

## 2016-06-26 DIAGNOSIS — G510 Bell's palsy: Secondary | ICD-10-CM | POA: Insufficient documentation

## 2016-06-26 HISTORY — DX: Bell's palsy: G51.0

## 2016-06-26 MED ORDER — PREDNISONE 20 MG PO TABS: ORAL_TABLET | ORAL | 0 refills | Status: DC

## 2016-06-26 MED ORDER — ACYCLOVIR 200 MG PO CAPS: 1000.0000 mg | ORAL_CAPSULE | Freq: Three times a day (TID) | ORAL | 0 refills | Status: AC

## 2016-06-26 MED ORDER — ERYTHROMYCIN 5 MG/GM OP OINT
TOPICAL_OINTMENT | Freq: Every day | OPHTHALMIC | Status: DC
  Administered 2016-06-26: 18:00:00 via OPHTHALMIC
  Filled 2016-06-26: qty 3.5

## 2016-06-26 NOTE — ED Notes (Signed)
Patient verbalizes understanding of discharge instructions, prescriptions, home care and follow up care. Patient out of department at this time. 

## 2016-06-26 NOTE — ED Triage Notes (Signed)
Pt states that she was diagnosed with Bell's Palsy on 8/14. States she was sent home with prednisone. Pt reports no improvement in symptoms. Pt requesting more medication. States she is unable to follow up with PCP because she does not have one.

## 2016-06-28 NOTE — ED Provider Notes (Signed)
[facility] Provider Note   CSN: [id number] Arrival date & time: 06/26/16  1617     History   Chief Complaint Chief Complaint  Patient presents with  . Bell's Palsy    HPI Virginia Mills is a 20 y.o. female presenting for re-evaluation of her bells palsy symptoms, stating her left sided facial droop which involves her forehead has not improved in any way since completing her course of prednisone.  She was seen here 9 days ago at which time she was placed on a 60 mg prednisone pulse course.  She denies any progression of symptoms including no peripheral weakness or numbness, visual changes, headaches, dizziness and denies facial numbness.  She has noticed decreased taste sensation of her left tongue. Past medical history is significant for HSV 2 seropositive but denies any hx of herpes sx.   The history is provided by the patient.    Past Medical History:  Diagnosis Date  . Bell's palsy   . Medical history non-contributory     Patient Active Problem List   Diagnosis Date Noted  . Vaginal delivery 11/17/2013  . Active labor 11/15/2013  . 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 IUGR (intrauterine growth restriction) 11/08/2013  . URI (upper respiratory infection) 06/30/2013  . Marijuana use 04/24/2013  . Supervision of other normal pregnancy 04/20/2013    Past Surgical History:  Procedure Laterality Date  . INDUCED ABORTION    . INDUCED ABORTION      OB History    Gravida Para Term Preterm AB Living   3 1 1   1 1    SAB TAB Ectopic Multiple Live Births     1     1       Home Medications    Prior to Admission medications   Medication Sig Start Date End Date Taking? Authorizing Provider  acyclovir (ZOVIRAX) 200 MG capsule Take 5 capsules (1,000 mg total) by mouth 3 (three) times daily. 06/26/16 07/03/16  Burgess Amor, PA-C  ibuprofen (ADVIL,MOTRIN) 200 MG tablet Take 400 mg by mouth every 6 (six) hours as needed.    Historical Provider, MD  predniSONE (DELTASONE)  20 MG tablet Take 3 tablets daily for 7 days 06/26/16   Burgess Amor, PA-C    Family History Family History  Problem Relation Age of Onset  . Diabetes Paternal Grandmother   . Hypertension Paternal Grandmother   . Diabetes Paternal Grandfather   . Hypertension Paternal Grandfather     Social History Social History  Substance Use Topics  . Smoking status: Current Every Day Smoker    Packs/day: 0.50    Types: Cigarettes  . Smokeless tobacco: Never Used  . Alcohol use Yes     Comment: on weekends     Allergies   Review of patient's allergies indicates no known allergies.   Review of Systems Review of Systems  Constitutional: Negative for fever.  HENT: Negative for congestion and sore throat.   Eyes: Negative.   Respiratory: Negative for chest tightness and shortness of breath.   Cardiovascular: Negative for chest pain.  Gastrointestinal: Negative for abdominal pain and nausea.  Genitourinary: Negative.   Musculoskeletal: Negative for arthralgias, joint swelling and neck pain.  Skin: Negative.  Negative for rash and wound.  Neurological: Positive for facial asymmetry. Negative for dizziness, speech difficulty, light-headedness, numbness and headaches.  Psychiatric/Behavioral: Negative.      Physical Exam Updated Vital Signs BP 123/75 (BP Location: Right Arm)   Pulse 68   Temp 98 F (  36.7 C) (Oral)   Resp 14   Ht 5\' 5"  (1.651 m)   Wt 56.7 kg   LMP 06/03/2016   SpO2 100%   BMI 20.80 kg/m   Physical Exam  Constitutional: She is oriented to person, place, and time. She appears well-developed and well-nourished.  HENT:  Head: Normocephalic and atraumatic.  Eyes: Conjunctivae and EOM are normal. Pupils are equal, round, and reactive to light.  Neck: Normal range of motion.  Cardiovascular: Normal rate, regular rhythm, normal heart sounds and intact distal pulses.   Pulmonary/Chest: Effort normal and breath sounds normal. She has no wheezes.  Abdominal: Soft. Bowel  sounds are normal. There is no tenderness.  Musculoskeletal: Normal range of motion.  Neurological: She is alert and oriented to person, place, and time. She displays normal reflexes. She exhibits normal muscle tone. Coordination normal.  Complete left facial droop without forehead sparing.  Tongue and uvula midline.  Normal sensation to light touch.   Skin: Skin is warm and dry. No rash noted.  Psychiatric: She has a normal mood and affect.  Nursing note and vitals reviewed.    ED Treatments / Results  Labs (all labs ordered are listed, but only abnormal results are displayed) Labs Reviewed - No data to display  EKG  EKG Interpretation None       Radiology No results found.  Procedures Procedures (including critical care time)  Medications Ordered in ED Medications - No data to display   Initial Impression / Assessment and Plan / ED Course  I have reviewed the triage vital signs and the nursing notes.  Pertinent labs & imaging results that were available during my care of the patient were reviewed by me and considered in my medical decision making (see chart for details).  Clinical Course    Case discussed with Dr. Deretha EmoryZackowski who helped formulate plan. Although sx began almost 2 weeks ago, will give another prednisone course and given HSV exposure hx, acyclovir prescribed. Discussed with pt that this anti viral may not help but wouldn't be harmful to try which she understands.  Also discussed that Bells can be very slow to resolve and occasionally does not resolve completely.  She was referred to neurology for further evaluation and tx.  She agrees to call for appt with neurology for f/u care.  She does endorse dry eye but denies pain or visual changes.  Given erythromycin ointment for qhs use for better eye protection while sleeping.  She may continue using her liquid tears during the day prn.  Final Clinical Impressions(s) / ED Diagnoses   Final diagnoses:  Bell's palsy     New Prescriptions Discharge Medication List as of 06/26/2016  5:38 PM    START taking these medications   Details  acyclovir (ZOVIRAX) 200 MG capsule Take 5 capsules (1,000 mg total) by mouth 3 (three) times daily., Starting Wed 06/26/2016, Until Wed 07/03/2016, Print    predniSONE (DELTASONE) 20 MG tablet Take 3 tablets daily for 7 days, Print         Burgess AmorJulie Ada Woodbury, PA-C 06/28/16 1411    Vanetta MuldersScott Zackowski, MD 07/03/16 0830

## 2017-03-29 IMAGING — CT CT HEAD W/O CM
3 of 4 series · 16 of 47 positions shown, 19 images · non-contrast
Comparison: March 04, 2010

CLINICAL DATA: Three-day history of left-sided facial numbness and
loss of taste sensation

EXAM:
CT HEAD WITHOUT CONTRAST
TECHNIQUE: Contiguous axial images were obtained from the base of the skull
through the vertex without intravenous contrast.

[Series 2: head w/o · axial · non-contrast · 0.38mm/px · z∈[+1086,+1214]mm · 10 of 38 slices shown, 13 images]
[im 3/38  brain]
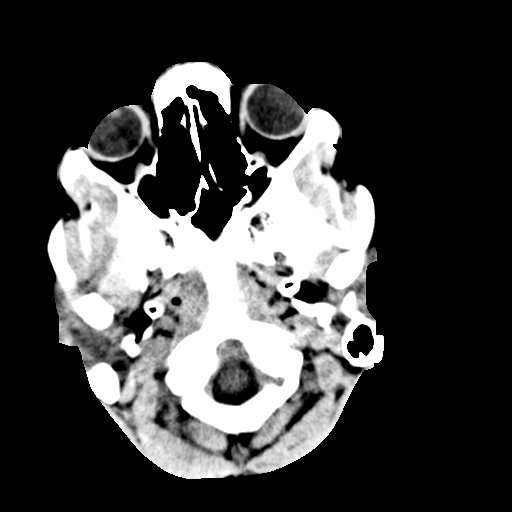
[im 3/38  bone]
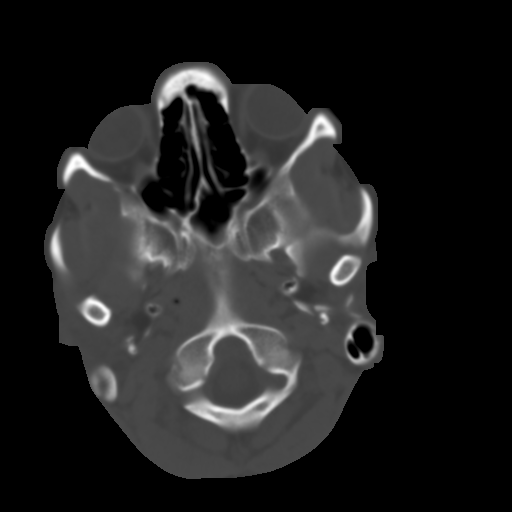
[im 6/38  brain]
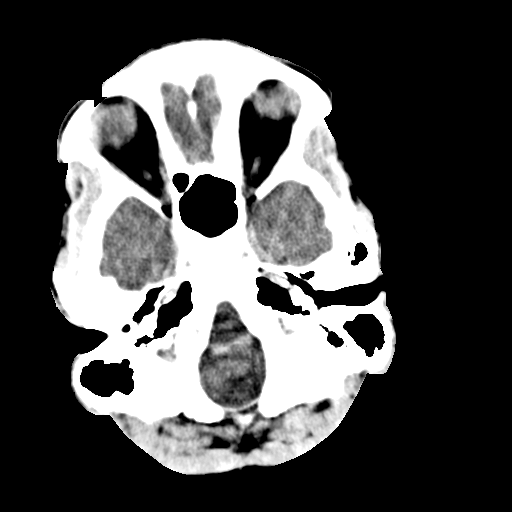
[im 11/38  brain]
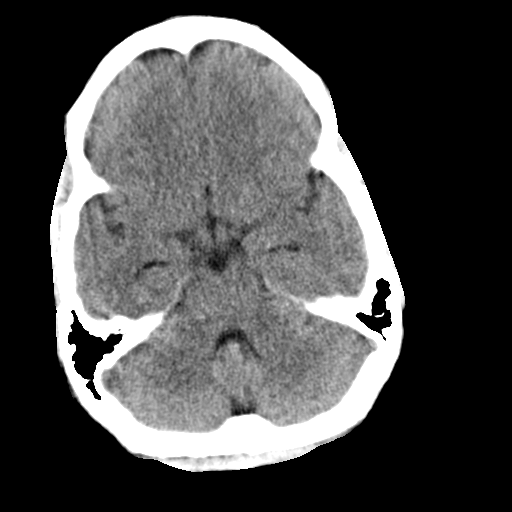
[im 14/38  brain]
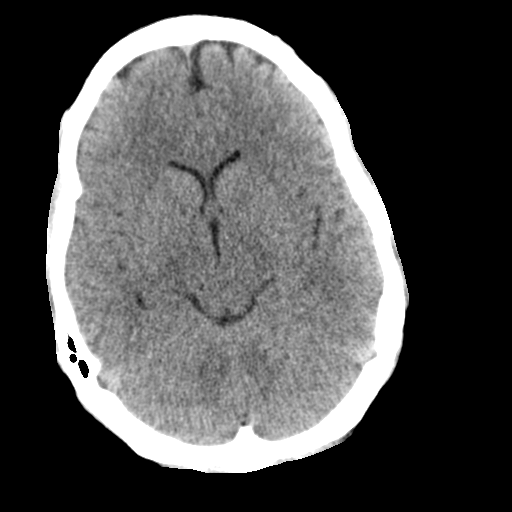
[im 16/38  brain]
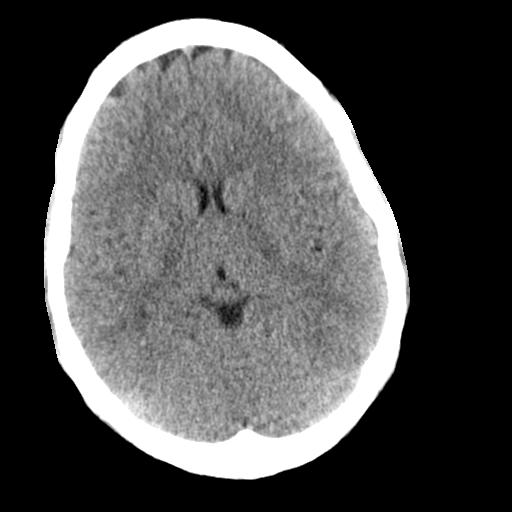
[im 16/38  bone]
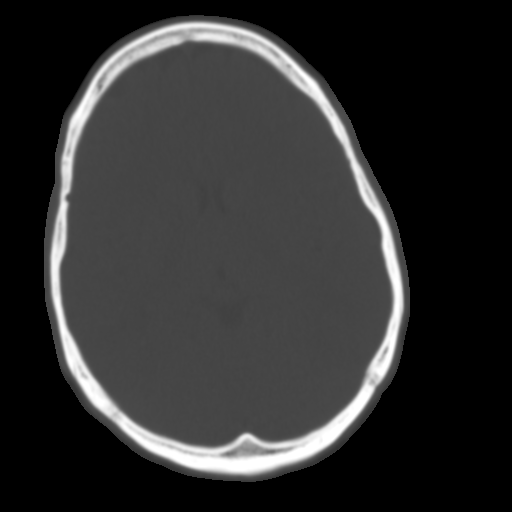
[im 22/38  brain]
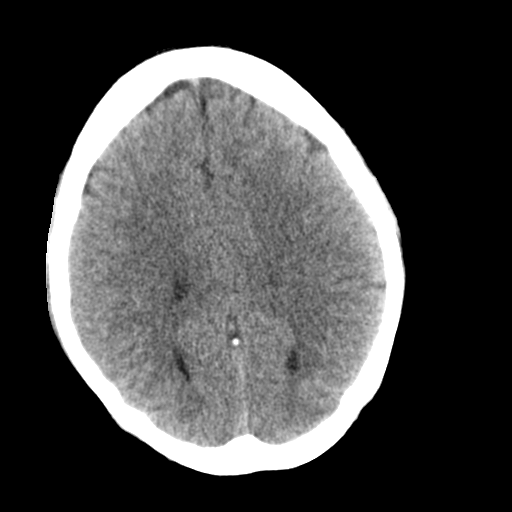
[im 24/38  brain]
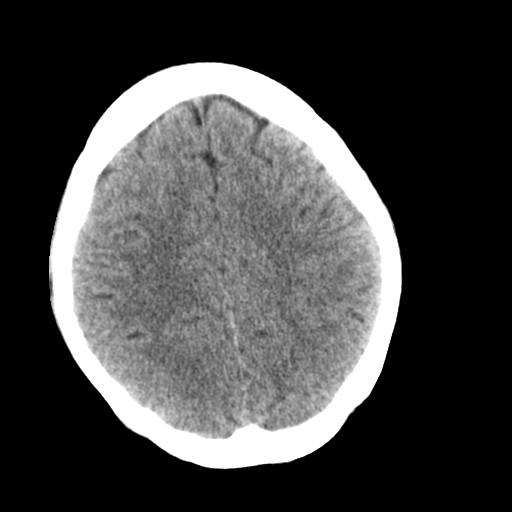
[im 27/38  brain]
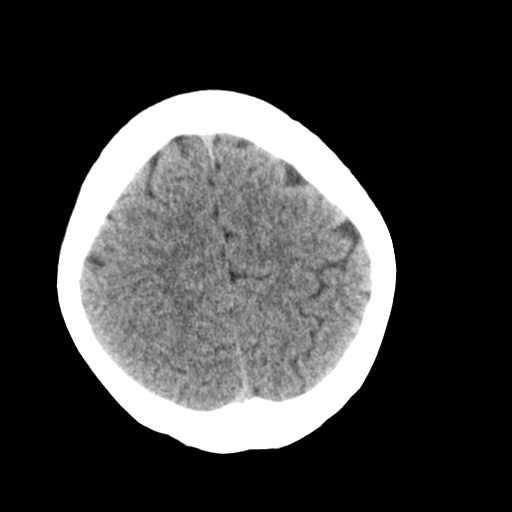
[im 32/38  brain]
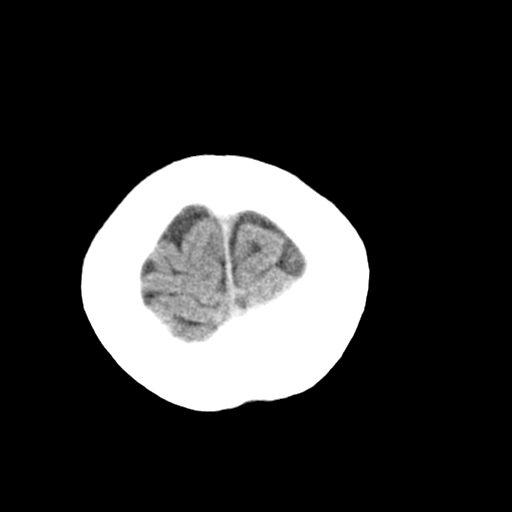
[im 32/38  bone]
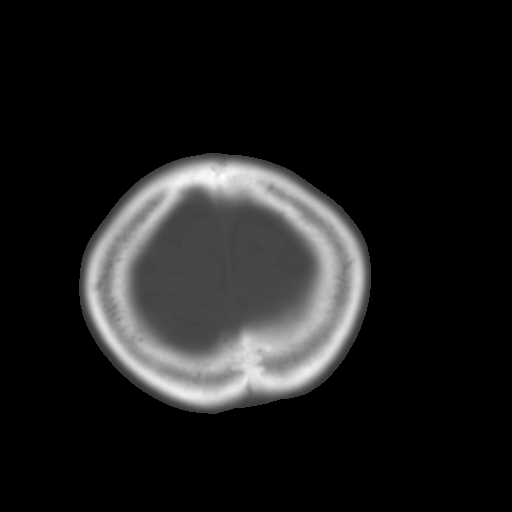
[im 35/38  brain]
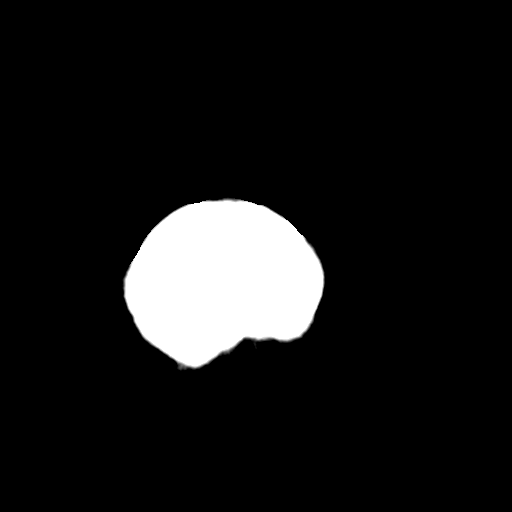

[Series 4: coronal · coronal · 0.32mm/px · 3 of 70 slices shown]
[im 24/70  brain]
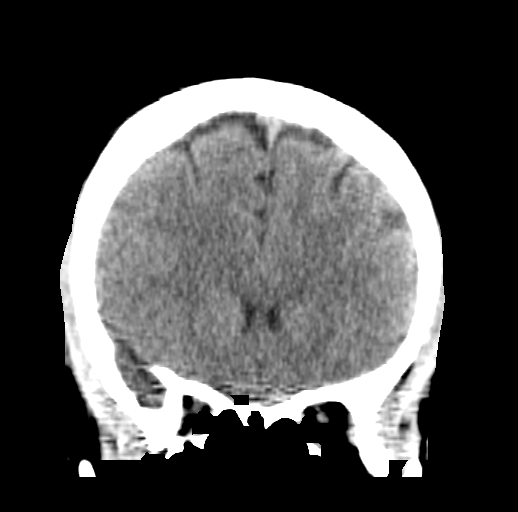
[im 31/70  brain]
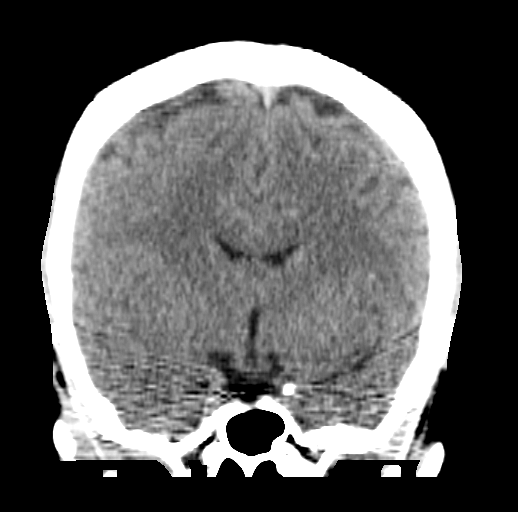
[im 39/70  brain]
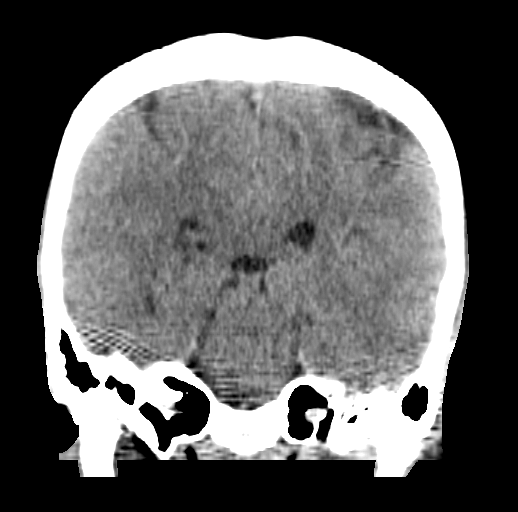

[Series 5: sagittal · sagittal · 0.30mm/px · 3 of 51 slices shown]
[im 17/51  brain]
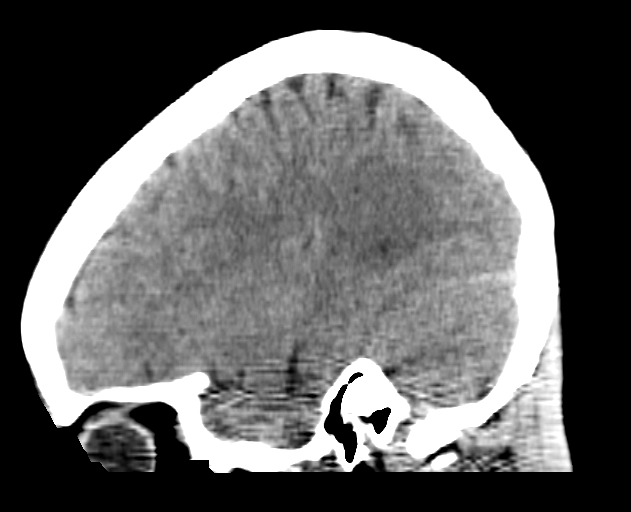
[im 26/51  brain]
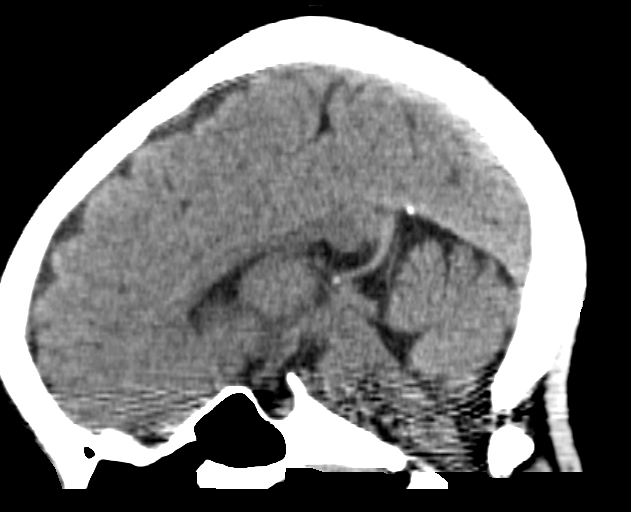
[im 34/51  brain]
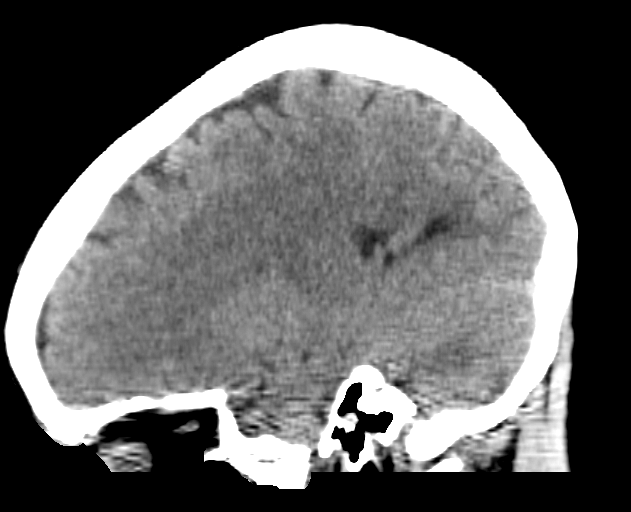

[16 of 47 positions shown; findings below may reference images not displayed]

FINDINGS: Brain: The ventricles are normal in size and configuration. There is
no intracranial mass, hemorrhage, extra-axial fluid collection, or
midline shift. Gray-white compartments appear normal. No acute
infarct evident.

Vascular: There is no hyperdense vessel. No arterial vascular
calcification evident.

Skull: Bony calvarium appears intact.

Sinuses/Orbits: There is incomplete visualization of an apparent
retention cyst in the posterior left maxillary antrum. Other
visualized paranasal sinuses are clear. Frontal sinuses are
essentially aplastic. Visualized orbits appear symmetric
bilaterally.

Other: Mastoid air cells clear.
IMPRESSION: Mild left maxillary sinus disease.  Study otherwise unremarkable.

## 2017-04-17 ENCOUNTER — Encounter (HOSPITAL_COMMUNITY): Payer: Self-pay | Admitting: Emergency Medicine

## 2017-04-17 ENCOUNTER — Emergency Department (HOSPITAL_COMMUNITY)
Admission: EM | Admit: 2017-04-17 | Discharge: 2017-04-17 | Disposition: A | Discharge status: Home / Self Care | Payer: Self-pay | Attending: Emergency Medicine | Admitting: Emergency Medicine

## 2017-04-17 DIAGNOSIS — K0889 Other specified disorders of teeth and supporting structures: Secondary | ICD-10-CM

## 2017-04-17 DIAGNOSIS — K029 Dental caries, unspecified: Secondary | ICD-10-CM | POA: Insufficient documentation

## 2017-04-17 DIAGNOSIS — F17210 Nicotine dependence, cigarettes, uncomplicated: Secondary | ICD-10-CM | POA: Insufficient documentation

## 2017-04-17 MED ORDER — AMOXICILLIN 500 MG PO CAPS: 500.0000 mg | ORAL_CAPSULE | Freq: Three times a day (TID) | ORAL | 0 refills | Status: DC

## 2017-04-17 MED ORDER — DICLOFENAC SODIUM 75 MG PO TBEC: 75.0000 mg | DELAYED_RELEASE_TABLET | Freq: Two times a day (BID) | ORAL | 0 refills | Status: DC

## 2017-04-17 NOTE — ED Provider Notes (Signed)
[facility] Provider Note   CSN: [id number] Arrival date & time: 04/17/17  1711     History   Chief Complaint Chief Complaint  Patient presents with  . Dental Pain    HPI Virginia Mills is a 21 y.o. female.  HPI   Virginia Mills is a 21 y.o. female who presents to the Emergency Department complaining of dental pain for 2-3 days.  States that her left lower "back tooth" broke off while chewing and she describes a constant, sharp, throbbing pain to her left jaw.  Pain worse with chewing and exposure to cold or hot food or liquids.  Pain unrelieved by tylenol or ibuprofen.  Denies fever, facial swelling, difficulty swallowing.  Pt has appt with dentist next week.     Past Medical History:  Diagnosis Date  . Bell's palsy   . Medical history non-contributory     Patient Active Problem List   Diagnosis Date Noted  . Vaginal delivery 11/17/2013  . Active labor 11/15/2013  . 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 IUGR (intrauterine growth restriction) 11/08/2013  . URI (upper respiratory infection) 06/30/2013  . Marijuana use 04/24/2013  . Supervision of other normal pregnancy 04/20/2013    Past Surgical History:  Procedure Laterality Date  . INDUCED ABORTION    . INDUCED ABORTION      OB History    Gravida Para Term Preterm AB Living   3 1 1   1 1    SAB TAB Ectopic Multiple Live Births     1     1       Home Medications    Prior to Admission medications   Medication Sig Start Date End Date Taking? Authorizing Provider  amoxicillin (AMOXIL) 500 MG capsule Take 1 capsule (500 mg total) by mouth 3 (three) times daily. For 10 days 04/17/17   Pauline Ausriplett, Amiel Sharrow, PA-C  diclofenac (VOLTAREN) 75 MG EC tablet Take 1 tablet (75 mg total) by mouth 2 (two) times daily. Take with food 04/17/17   Dali Kraner, PA-C  ibuprofen (ADVIL,MOTRIN) 200 MG tablet Take 400 mg by mouth every 6 (six) hours as needed.    [provider]  predniSONE (DELTASONE) 20 MG tablet  Take 3 tablets daily for 7 days 06/26/16   Burgess AmorIdol, Julie, PA-C    Family History Family History  Problem Relation Age of Onset  . Diabetes Paternal Grandmother   . Hypertension Paternal Grandmother   . Diabetes Paternal Grandfather   . Hypertension Paternal Grandfather     Social History Social History  Substance Use Topics  . Smoking status: Current Every Day Smoker    Packs/day: 0.50    Types: Cigarettes  . Smokeless tobacco: Never Used  . Alcohol use Yes     Comment: on weekends     Allergies   Patient has no known allergies.   Review of Systems Review of Systems  Constitutional: Negative for appetite change and fever.  HENT: Positive for dental problem. Negative for congestion, facial swelling, sore throat and trouble swallowing.   Eyes: Negative for pain and visual disturbance.  Musculoskeletal: Negative for neck pain and neck stiffness.  Neurological: Negative for dizziness, facial asymmetry and headaches.  Hematological: Negative for adenopathy.  All other systems reviewed and are negative.    Physical Exam Updated Vital Signs BP (!) 141/87 (BP Location: Left Arm)   Pulse 68   Temp 97.7 F (36.5 C) (Temporal)   Resp 18   Ht 5\' 5"  (1.651 m)  Wt 59 kg (130 lb)   LMP 04/07/2017   SpO2 94%   BMI 21.63 kg/m   Physical Exam  Constitutional: She is oriented to person, place, and time. She appears well-developed and well-nourished.  Pt uncomfortable appearing, tearful, holding her left jaw with her hand  HENT:  Head: Normocephalic and atraumatic.  Right Ear: Tympanic membrane and ear canal normal.  Left Ear: Tympanic membrane and ear canal normal.  Mouth/Throat: Uvula is midline, oropharynx is clear and moist and mucous membranes are normal. No trismus in the jaw. Dental caries present. No dental abscesses or uvula swelling.  Tenderness to palpation and dental caries with partial avulsion of the left lower second molar. No facial swelling, obvious dental  abscess, trismus, or sublingual abnml.    Neck: Normal range of motion. Neck supple.  Cardiovascular: Normal rate, regular rhythm and normal heart sounds.   No murmur heard. Pulmonary/Chest: Effort normal and breath sounds normal.  Musculoskeletal: Normal range of motion.  Lymphadenopathy:    She has no cervical adenopathy.  Neurological: She is alert and oriented to person, place, and time. She exhibits normal muscle tone. Coordination normal.  Skin: Skin is warm and dry.  Nursing note and vitals reviewed.    ED Treatments / Results  Labs (all labs ordered are listed, but only abnormal results are displayed) Labs Reviewed - No data to display  EKG  EKG Interpretation None       Radiology No results found.  Procedures Procedures (including critical care time)  Medications Ordered in ED Medications - No data to display   Initial Impression / Assessment and Plan / ED Course  I have reviewed the triage vital signs and the nursing notes.  Pertinent labs & imaging results that were available during my care of the patient were reviewed by me and considered in my medical decision making (see chart for details).     Pt non-toxic appearing. No concerning sx's for dental abscess or Ludwig's again.  Dental caries present.  Pt has appt next week with her dentist.  Appears stable for d/c  Final Clinical Impressions(s) / ED Diagnoses   Final diagnoses:  Pain, dental    New Prescriptions New Prescriptions   AMOXICILLIN (AMOXIL) 500 MG CAPSULE    Take 1 capsule (500 mg total) by mouth 3 (three) times daily. For 10 days   DICLOFENAC (VOLTAREN) 75 MG EC TABLET    Take 1 tablet (75 mg total) by mouth 2 (two) times daily. Take with food     Pauline Aus, PA-C 04/17/17 1807    Lavera Guise, MD 04/17/17 [phone]

## 2017-04-17 NOTE — ED Triage Notes (Addendum)
Pt reports left sided dental pain for last several days. Pt reports "tooth broke off". Pt reports has dental appointment next week but reports pain too intense to wait. nad noted. PA assessing patient in triage.

## 2017-04-17 NOTE — Discharge Instructions (Signed)
Be sure to keep your dental appt next week.

## 2017-07-22 ENCOUNTER — Encounter (HOSPITAL_COMMUNITY): Payer: Self-pay | Admitting: Emergency Medicine

## 2017-07-22 ENCOUNTER — Emergency Department (HOSPITAL_COMMUNITY)
Admission: EM | Admit: 2017-07-22 | Discharge: 2017-07-22 | Disposition: A | Discharge status: Home / Self Care | Payer: Self-pay | Attending: Emergency Medicine | Admitting: Emergency Medicine

## 2017-07-22 DIAGNOSIS — K029 Dental caries, unspecified: Secondary | ICD-10-CM | POA: Insufficient documentation

## 2017-07-22 DIAGNOSIS — K047 Periapical abscess without sinus: Secondary | ICD-10-CM | POA: Insufficient documentation

## 2017-07-22 DIAGNOSIS — Z5321 Procedure and treatment not carried out due to patient leaving prior to being seen by health care provider: Secondary | ICD-10-CM | POA: Insufficient documentation

## 2017-07-22 DIAGNOSIS — K0889 Other specified disorders of teeth and supporting structures: Secondary | ICD-10-CM | POA: Insufficient documentation

## 2017-07-22 DIAGNOSIS — F17210 Nicotine dependence, cigarettes, uncomplicated: Secondary | ICD-10-CM | POA: Insufficient documentation

## 2017-07-22 DIAGNOSIS — Z79899 Other long term (current) drug therapy: Secondary | ICD-10-CM | POA: Insufficient documentation

## 2017-07-22 DIAGNOSIS — G510 Bell's palsy: Secondary | ICD-10-CM | POA: Insufficient documentation

## 2017-07-22 MED ORDER — CLINDAMYCIN HCL 150 MG PO CAPS: 300.0000 mg | ORAL_CAPSULE | Freq: Four times a day (QID) | ORAL | 0 refills | Status: DC

## 2017-07-22 MED ORDER — CLINDAMYCIN PHOSPHATE 600 MG/50ML IV SOLN
600.0000 mg | Freq: Once | INTRAVENOUS | Status: AC
  Administered 2017-07-22: 600 mg via INTRAVENOUS
  Filled 2017-07-22: qty 50

## 2017-07-22 MED ORDER — DICLOFENAC SODIUM 75 MG PO TBEC: 75.0000 mg | DELAYED_RELEASE_TABLET | Freq: Two times a day (BID) | ORAL | 0 refills | Status: DC

## 2017-07-22 MED ORDER — KETOROLAC TROMETHAMINE 30 MG/ML IJ SOLN
30.0000 mg | Freq: Once | INTRAMUSCULAR | Status: AC
  Administered 2017-07-22: 30 mg via INTRAVENOUS
  Filled 2017-07-22: qty 1

## 2017-07-22 NOTE — ED Triage Notes (Signed)
Pt returns, left early and went home to sleep, facial swelling from broken lower left tooth. 10/10 pain

## 2017-07-22 NOTE — Discharge Instructions (Signed)
Warm salt water rinses.  It's important to call your dentist this week to arrange a follow-up appt.  Return here for any worsening symptoms

## 2017-07-22 NOTE — ED Provider Notes (Signed)
[facility]-EM[facility]Y DEPT Provider Note   CSN: [id number] Arrival date & time: 07/22/17  1459     History   Chief Complaint Chief Complaint  Patient presents with  . Facial Swelling    HPI Virginia Mills is a 21 y.o. female.  HPI  Virginia Mills is a 21 y.o. female who presents to the Emergency Department complaining of recurrent dental pain for one day, woke this morning with left lower facial swelling and increased dental pain.  She admits to dental decay and sees an orthodontist, but does not see a dentist.  Complains of worsening pain associated with chewing, hot or cold foods or liquids.  Has not tried any therapies or medications at home.  She denies difficulty swallowing, fever, neck pain or difficulty opening or closing her mouth.    Past Medical History:  Diagnosis Date  . Bell's palsy   . Medical history non-contributory     Patient Active Problem List   Diagnosis Date Noted  . Vaginal delivery 11/17/2013  . Active labor 11/15/2013  . 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 IUGR (intrauterine growth restriction) 11/08/2013  . URI (upper respiratory infection) 06/30/2013  . Marijuana use 04/24/2013  . Supervision of other normal pregnancy 04/20/2013    Past Surgical History:  Procedure Laterality Date  . INDUCED ABORTION    . INDUCED ABORTION      OB History    Gravida Para Term Preterm AB Living   SAB TAB Ectopic Multiple Live Births     1     1       Home Medications    Prior to Admission medications   Medication Sig Start Date End Date Taking? Authorizing Provider  ibuprofen (ADVIL,MOTRIN) 200 MG tablet Take 400 mg by mouth every 6 (six) hours as needed.   Yes [provider]  amoxicillin (AMOXIL) 500 MG capsule Take 1 capsule (500 mg total) by mouth 3 (three) times daily. For 10 days Patient not taking: Reported on 07/22/2017 04/17/17   Pauline Aus, PA-C  diclofenac (VOLTAREN) 75 MG EC tablet Take 1 tablet (75 mg total) by  mouth 2 (two) times daily. Take with food Patient not taking: Reported on 07/22/2017 04/17/17   Pauline Aus, PA-C    Family History Family History  Problem Relation Age of Onset  . Diabetes Paternal Grandmother   . Hypertension Paternal Grandmother   . Diabetes Paternal Grandfather   . Hypertension Paternal Grandfather     Social History Social History  Substance Use Topics  . Smoking status: Current Every Day Smoker    Packs/day: 0.50    Types: Cigarettes  . Smokeless tobacco: Never Used  . Alcohol use Yes     Comment: on weekends     Allergies   Patient has no known allergies.   Review of Systems Review of Systems  Constitutional: Negative for appetite change and fever.  HENT: Positive for dental problem and facial swelling. Negative for congestion, sore throat and trouble swallowing.   Eyes: Negative for pain and visual disturbance.  Musculoskeletal: Negative for neck pain and neck stiffness.  Neurological: Negative for dizziness, facial asymmetry and headaches.  Hematological: Negative for adenopathy.  All other systems reviewed and are negative.    Physical Exam Updated Vital Signs BP 130/75   Pulse 69   Temp 100 F (37.8 C) (Oral)   Resp 19   Ht  (1.651 m)   Wt 54.4 kg (120  lb)   LMP 07/21/2017   SpO2 100%   BMI 19.97 kg/m   Physical Exam  Constitutional: She is oriented to person, place, and time. She appears well-developed and well-nourished. No distress.  HENT:  Head: Normocephalic and atraumatic.  Right Ear: Tympanic membrane and ear canal normal.  Left Ear: Tympanic membrane and ear canal normal.  Mouth/Throat: Uvula is midline, oropharynx is clear and moist and mucous membranes are normal. No trismus in the jaw. Dental caries present. No dental abscesses or uvula swelling.  tenderness and dental caries of the left lower bicuspids. Focal edema of the left lower face.  NO fluctuance or edema of the surrounding gingiva.  No trismus, or  sublingual abnml.    Neck: Normal range of motion, full passive range of motion without pain and phonation normal. Neck supple.  Cardiovascular: Normal rate, regular rhythm and normal heart sounds.   No murmur heard. Pulmonary/Chest: Effort normal and breath sounds normal.  Musculoskeletal: Normal range of motion.  Lymphadenopathy:    She has no cervical adenopathy.  Neurological: She is alert and oriented to person, place, and time. She exhibits normal muscle tone. Coordination normal.  Skin: Skin is warm and dry.  Nursing note and vitals reviewed.    ED Treatments / Results  Labs (all labs ordered are listed, but only abnormal results are displayed) Labs Reviewed - No data to display  EKG  EKG Interpretation None       Radiology No results found.  Procedures Procedures (including critical care time)  Medications Ordered in ED Medications  clindamycin (CLEOCIN) IVPB 600 mg (600 mg Intravenous New Bag/Given 07/22/17 1715)  ketorolac (TORADOL) 30 MG/ML injection 30 mg (30 mg Intravenous Given 07/22/17 1711)     Initial Impression / Assessment and Plan / ED Course  I have reviewed the triage vital signs and the nursing notes.  Pertinent labs & imaging results that were available during my care of the patient were reviewed by me and considered in my medical decision making (see chart for details).     Focal facial edema likely secondary to dental abscess.  No drainable abscess at present.  Airway patent.  No trismus.  No Concerning sx's for Ludwig's angina.  Rx for clinda, diclofenac for pain, pt agrees to close f/u with dentistry, return precautions discussed.   Final Clinical Impressions(s) / ED Diagnoses   Final diagnoses:  Dental abscess    New Prescriptions New Prescriptions   No medications on file     Pauline Aus, Cordelia Poche 07/22/17 1749    Doug Sou, MD 07/22/17 2324

## 2017-07-22 NOTE — ED Notes (Signed)
Pt seeing that a lot of people are waiting. Pt is calling her family to pick her up, she said last time she was seen in triage and given a prescription and went home. She is not willing to wait.

## 2017-07-23 ENCOUNTER — Emergency Department (HOSPITAL_COMMUNITY)
Admission: EM | Admit: 2017-07-23 | Discharge: 2017-07-23 | Disposition: A | Discharge status: Home / Self Care | Payer: Self-pay | Attending: Emergency Medicine | Admitting: Emergency Medicine

## 2017-07-23 ENCOUNTER — Encounter (HOSPITAL_COMMUNITY): Payer: Self-pay

## 2017-07-23 DIAGNOSIS — K047 Periapical abscess without sinus: Secondary | ICD-10-CM | POA: Insufficient documentation

## 2017-07-23 DIAGNOSIS — F17210 Nicotine dependence, cigarettes, uncomplicated: Secondary | ICD-10-CM | POA: Insufficient documentation

## 2017-07-23 LAB — CBC WITH DIFFERENTIAL/PLATELET
Basophils Absolute: 0 10*3/uL (ref 0.0–0.1)
Basophils Relative: 0 %
EOS ABS: 0.1 10*3/uL (ref 0.0–0.7)
EOS PCT: 1 %
HCT: 41.8 % (ref 36.0–46.0)
Hemoglobin: 13.9 g/dL (ref 12.0–15.0)
Lymphocytes Relative: 14 %
Lymphs Abs: 1 10*3/uL (ref 0.7–4.0)
MCH: 28.7 pg (ref 26.0–34.0)
MCHC: 33.3 g/dL (ref 30.0–36.0)
MCV: 86.4 fL (ref 78.0–100.0)
Monocytes Absolute: 0.7 10*3/uL (ref 0.1–1.0)
Monocytes Relative: 9 %
Neutro Abs: 5.4 10*3/uL (ref 1.7–7.7)
Neutrophils Relative %: 76 %
PLATELETS: 181 10*3/uL (ref 150–400)
RBC: 4.84 MIL/uL (ref 3.87–5.11)
RDW: 14 % (ref 11.5–15.5)
WBC: 7.2 10*3/uL (ref 4.0–10.5)

## 2017-07-23 LAB — BASIC METABOLIC PANEL
Anion gap: 9 (ref 5–15)
BUN: 9 mg/dL (ref 6–20)
CO2: 26 mmol/L (ref 22–32)
CREATININE: 0.84 mg/dL (ref 0.44–1.00)
Calcium: 9.4 mg/dL (ref 8.9–10.3)
Chloride: 100 mmol/L — ABNORMAL LOW (ref 101–111)
GFR calc Af Amer: >60 mL/min (ref >60)
GFR calc non Af Amer: >60 mL/min (ref >60)
Glucose, Bld: 109 mg/dL — ABNORMAL HIGH (ref 65–99)
Potassium: 3.6 mmol/L (ref 3.5–5.1)
SODIUM: 135 mmol/L (ref 135–145)

## 2017-07-23 MED ORDER — SODIUM CHLORIDE 0.9 % IV BOLUS (SEPSIS)
1000.0000 mL | Freq: Once | INTRAVENOUS | Status: AC
  Administered 2017-07-23: 1000 mL via INTRAVENOUS

## 2017-07-23 MED ORDER — CLINDAMYCIN PHOSPHATE 600 MG/50ML IV SOLN
600.0000 mg | Freq: Once | INTRAVENOUS | Status: AC
  Administered 2017-07-23: 600 mg via INTRAVENOUS
  Filled 2017-07-23: qty 50

## 2017-07-23 MED ORDER — ONDANSETRON HCL 4 MG/2ML IJ SOLN
4.0000 mg | Freq: Once | INTRAMUSCULAR | Status: AC
  Administered 2017-07-23: 4 mg via INTRAVENOUS
  Filled 2017-07-23: qty 2

## 2017-07-23 MED ORDER — METRONIDAZOLE 500 MG PO TABS: 500.0000 mg | ORAL_TABLET | Freq: Two times a day (BID) | ORAL | 0 refills | Status: DC

## 2017-07-23 MED ORDER — HYDROCODONE-ACETAMINOPHEN 5-325 MG PO TABS: 1.0000 | ORAL_TABLET | ORAL | 0 refills | Status: DC | PRN

## 2017-07-23 MED ORDER — METRONIDAZOLE IN NACL 5-0.79 MG/ML-% IV SOLN
500.0000 mg | Freq: Once | INTRAVENOUS | Status: AC
  Administered 2017-07-23: 500 mg via INTRAVENOUS
  Filled 2017-07-23: qty 100

## 2017-07-23 MED ORDER — MORPHINE SULFATE (PF) 4 MG/ML IV SOLN
4.0000 mg | Freq: Once | INTRAVENOUS | Status: AC
  Administered 2017-07-23: 4 mg via INTRAVENOUS
  Filled 2017-07-23: qty 1

## 2017-07-23 NOTE — Discharge Instructions (Signed)
Continue to take clindamycin and voltaren.  Also take probiotic pills or yogurt to prevent a yeast infection or diarrhea.

## 2017-07-23 NOTE — ED Triage Notes (Signed)
Pt reports was treated for a dental abscess yesterday.  Reports pain worse today and feels dizzy.

## 2017-07-23 NOTE — ED Provider Notes (Signed)
[facility] Provider Note   CSN: [id number] Arrival date & time: 07/23/17  1103     History   Chief Complaint Chief Complaint  Patient presents with  . Abscess    HPI Virginia Mills is a 21 y.o. female.  Pt presents to the ED today c/o dental abscess.  Pt said she's had dental pain for 2 days.  She did come to the ED yesterday for the same.  Pt was given clindamycin IV yesterday and a rx for clindamycin.  She said she woke up this morning with worsening of sx.  She does not have a Education officer, community.      Past Medical History:  Diagnosis Date  . Bell's palsy   . Medical history non-contributory     Patient Active Problem List   Diagnosis Date Noted  . Vaginal delivery 11/17/2013  . Active labor 11/15/2013  . 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 IUGR (intrauterine growth restriction) 11/08/2013  . URI (upper respiratory infection) 06/30/2013  . Marijuana use 04/24/2013  . Supervision of other normal pregnancy 04/20/2013    Past Surgical History:  Procedure Laterality Date  . INDUCED ABORTION    . INDUCED ABORTION      OB History    Gravida Para Term Preterm AB Living   SAB TAB Ectopic Multiple Live Births     1     1       Home Medications    Prior to Admission medications   Medication Sig Start Date End Date Taking? Authorizing Provider  clindamycin (CLEOCIN) 150 MG capsule Take 2 capsules (300 mg total) by mouth 4 (four) times daily. For 7 days 07/22/17  Yes Triplett, Tammy, PA-C  diclofenac (VOLTAREN) 75 MG EC tablet Take 1 tablet (75 mg total) by mouth 2 (two) times daily. Take with food 07/22/17  Yes Triplett, Tammy, PA-C  ibuprofen (ADVIL,MOTRIN) 200 MG tablet Take 400 mg by mouth every 6 (six) hours as needed.   Yes [provider]  HYDROcodone-acetaminophen (NORCO/VICODIN) 5-325 MG tablet Take 1 tablet by mouth every 4 (four) hours as needed. 07/23/17   Jacalyn Lefevre, MD  metroNIDAZOLE (FLAGYL) 500 MG tablet Take 1 tablet (500  mg total) by mouth 2 (two) times daily. 07/23/17   Jacalyn Lefevre, MD    Family History Family History  Problem Relation Age of Onset  . Diabetes Paternal Grandmother   . Hypertension Paternal Grandmother   . Diabetes Paternal Grandfather   . Hypertension Paternal Grandfather     Social History Social History  Substance Use Topics  . Smoking status: Current Every Day Smoker    Packs/day: 0.50    Types: Cigarettes  . Smokeless tobacco: Never Used  . Alcohol use Yes     Comment: on weekends     Allergies   Patient has no known allergies.   Review of Systems Review of Systems  HENT: Positive for dental problem.   All other systems reviewed and are negative.    Physical Exam Updated Vital Signs BP 135/89   Pulse 89   Temp 99.7 F (37.6 C) (Oral)   Resp 18   Ht  (1.651 m)   Wt 54.4 kg (120 lb)   LMP 07/21/2017   SpO2 99%   BMI 19.97 kg/m   Physical Exam  Constitutional: She is oriented to person, place, and time. She appears well-developed and well-nourished.  HENT:  Head: Normocephalic and atraumatic.  Right Ear: External  ear normal.  Left Ear: External ear normal.  Nose: Nose normal.  Dental caries.  Left sided facial swelling.  No ludwig's angina. Braces on top teeth.  Eyes: Pupils are equal, round, and reactive to light. Conjunctivae and EOM are normal.  Neck: Normal range of motion. Neck supple.  Cardiovascular: Normal rate, regular rhythm, normal heart sounds and intact distal pulses.   Pulmonary/Chest: Effort normal and breath sounds normal.  Abdominal: Soft. Bowel sounds are normal.  Musculoskeletal: Normal range of motion.  Lymphadenopathy:    She has cervical adenopathy.  Neurological: She is alert and oriented to person, place, and time.  Skin: Skin is warm.  Psychiatric: She has a normal mood and affect. Her behavior is normal. Judgment and thought content normal.  Nursing note and vitals reviewed.    ED Treatments / Results   Labs (all labs ordered are listed, but only abnormal results are displayed) Labs Reviewed  BASIC METABOLIC PANEL - Abnormal; Notable for the following:       Result Value   Chloride 100 (*)    Glucose, Bld 109 (*)    All other components within normal limits  CBC WITH DIFFERENTIAL/PLATELET    EKG  EKG Interpretation None       Radiology No results found.  Procedures Procedures (including critical care time)  Medications Ordered in ED Medications  clindamycin (CLEOCIN) IVPB 600 mg (600 mg Intravenous New Bag/Given 07/23/17 1253)  sodium chloride 0.9 % bolus 1,000 mL (1,000 mLs Intravenous New Bag/Given 07/23/17 1154)  morphine 4 MG/ML injection 4 mg (4 mg Intravenous Given 07/23/17 1154)  ondansetron (ZOFRAN) injection 4 mg (4 mg Intravenous Given 07/23/17 1154)  metroNIDAZOLE (FLAGYL) IVPB 500 mg (0 mg Intravenous Stopped 07/23/17 1254)     Initial Impression / Assessment and Plan / ED Course  I have reviewed the triage vital signs and the nursing notes.  Pertinent labs & imaging results that were available during my care of the patient were reviewed by me and considered in my medical decision making (see chart for details).     Pt is feeling better.  Swelling has come down some.  I will add flagyl and lortab to her regimen.  She is instructed to take probiotics.  She is encouraged to f/u with dentist.  Return if worse.  Final Clinical Impressions(s) / ED Diagnoses   Final diagnoses:  Dental abscess    New Prescriptions New Prescriptions   HYDROCODONE-ACETAMINOPHEN (NORCO/VICODIN) 5-325 MG TABLET    Take 1 tablet by mouth every 4 (four) hours as needed.   METRONIDAZOLE (FLAGYL) 500 MG TABLET    Take 1 tablet (500 mg total) by mouth 2 (two) times daily.     Jacalyn Lefevre, MD 07/23/17 1255

## 2017-12-08 ENCOUNTER — Encounter (HOSPITAL_COMMUNITY): Payer: Self-pay | Admitting: Emergency Medicine

## 2017-12-08 ENCOUNTER — Emergency Department (HOSPITAL_COMMUNITY)
Admission: EM | Admit: 2017-12-08 | Discharge: 2017-12-08 | Disposition: A | Discharge status: Home / Self Care | Payer: No Typology Code available for payment source | Attending: Emergency Medicine | Admitting: Emergency Medicine

## 2017-12-08 ENCOUNTER — Other Ambulatory Visit: Payer: Self-pay

## 2017-12-08 ENCOUNTER — Emergency Department (HOSPITAL_COMMUNITY): Payer: No Typology Code available for payment source

## 2017-12-08 DIAGNOSIS — Y999 Unspecified external cause status: Secondary | ICD-10-CM | POA: Diagnosis not present

## 2017-12-08 DIAGNOSIS — Y9389 Activity, other specified: Secondary | ICD-10-CM | POA: Diagnosis not present

## 2017-12-08 DIAGNOSIS — S161XXA Strain of muscle, fascia and tendon at neck level, initial encounter: Secondary | ICD-10-CM | POA: Diagnosis not present

## 2017-12-08 DIAGNOSIS — F17210 Nicotine dependence, cigarettes, uncomplicated: Secondary | ICD-10-CM | POA: Diagnosis not present

## 2017-12-08 DIAGNOSIS — Z79899 Other long term (current) drug therapy: Secondary | ICD-10-CM | POA: Insufficient documentation

## 2017-12-08 DIAGNOSIS — Y92410 Unspecified street and highway as the place of occurrence of the external cause: Secondary | ICD-10-CM | POA: Insufficient documentation

## 2017-12-08 DIAGNOSIS — S199XXA Unspecified injury of neck, initial encounter: Secondary | ICD-10-CM | POA: Diagnosis present

## 2017-12-08 MED ORDER — IBUPROFEN 400 MG PO TABS
400.0000 mg | ORAL_TABLET | Freq: Once | ORAL | Status: AC
  Administered 2017-12-08: 400 mg via ORAL
  Filled 2017-12-08: qty 1

## 2017-12-08 MED ORDER — IBUPROFEN 600 MG PO TABS: 600.0000 mg | ORAL_TABLET | Freq: Four times a day (QID) | ORAL | 0 refills | Status: DC | PRN

## 2017-12-08 MED ORDER — CYCLOBENZAPRINE HCL 5 MG PO TABS: 5.0000 mg | ORAL_TABLET | Freq: Three times a day (TID) | ORAL | 0 refills | Status: DC | PRN

## 2017-12-08 MED ORDER — CYCLOBENZAPRINE HCL 10 MG PO TABS
10.0000 mg | ORAL_TABLET | Freq: Once | ORAL | Status: AC
  Administered 2017-12-08: 10 mg via ORAL
  Filled 2017-12-08: qty 1

## 2017-12-08 NOTE — Discharge Instructions (Signed)
Apply ice packs on and off to your neck.  Follow-up with your primary doctor or return here for any worsening symptoms such as persistent vomiting, sudden visual changes, difficulty walking or standing and lethargy
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
## 2018-07-30 ENCOUNTER — Other Ambulatory Visit: Payer: Self-pay

## 2018-07-30 ENCOUNTER — Ambulatory Visit (INDEPENDENT_AMBULATORY_CARE_PROVIDER_SITE_OTHER): Payer: Self-pay | Admitting: Adult Health

## 2018-07-30 ENCOUNTER — Encounter: Payer: Self-pay | Admitting: Adult Health

## 2018-07-30 VITALS — BP 103/63 | HR 92 | Ht 65.0 in | Wt 148.0 lb

## 2018-07-30 DIAGNOSIS — O3680X0 Pregnancy with inconclusive fetal viability, not applicable or unspecified: Secondary | ICD-10-CM

## 2018-07-30 DIAGNOSIS — F32A Depression, unspecified: Secondary | ICD-10-CM

## 2018-07-30 DIAGNOSIS — Z3A01 Less than 8 weeks gestation of pregnancy: Secondary | ICD-10-CM | POA: Insufficient documentation

## 2018-07-30 DIAGNOSIS — F329 Major depressive disorder, single episode, unspecified: Secondary | ICD-10-CM

## 2018-07-30 DIAGNOSIS — Z3201 Encounter for pregnancy test, result positive: Secondary | ICD-10-CM

## 2018-07-30 DIAGNOSIS — O26851 Spotting complicating pregnancy, first trimester: Secondary | ICD-10-CM | POA: Insufficient documentation

## 2018-07-30 DIAGNOSIS — N926 Irregular menstruation, unspecified: Secondary | ICD-10-CM

## 2018-07-30 LAB — POCT URINE PREGNANCY: PREG TEST UR: POSITIVE — AB

## 2018-07-30 MED ORDER — PRENATAL VITAMINS 28-0.8 MG PO TABS: 1.0000 | ORAL_TABLET | Freq: Every day | ORAL | 12 refills | Status: DC

## 2018-07-30 NOTE — Patient Instructions (Signed)
First Trimester of Pregnancy The first trimester of pregnancy is from week 1 until the end of week 13 (months 1 through 3). A week after a sperm fertilizes an egg, the egg will implant on the wall of the uterus. This embryo will begin to develop into a baby. Genes from you and your partner will form the baby. The female genes will determine whether the baby will be a boy or a girl. At 6-8 weeks, the eyes and face will be formed, and the heartbeat can be seen on ultrasound. At the end of 12 weeks, all the baby's organs will be formed. Now that you are pregnant, you will want to do everything you can to have a healthy baby. Two of the most important things are to get good prenatal care and to follow your health care provider's instructions. Prenatal care is all the medical care you receive before the baby's birth. This care will help prevent, find, and treat any problems during the pregnancy and childbirth. Body changes during your first trimester Your body goes through many changes during pregnancy. The changes vary from woman to woman.  You may gain or lose a couple of pounds at first.  You may feel sick to your stomach (nauseous) and you may throw up (vomit). If the vomiting is uncontrollable, call your health care provider.  You may tire easily.  You may develop headaches that can be relieved by medicines. All medicines should be approved by your health care provider.  You may urinate more often. Painful urination may mean you have a bladder infection.  You may develop heartburn as a result of your pregnancy.  You may develop constipation because certain hormones are causing the muscles that push stool through your intestines to slow down.  You may develop hemorrhoids or swollen veins (varicose veins).  Your breasts may begin to grow larger and become tender. Your nipples may stick out more, and the tissue that surrounds them (areola) may become darker.  Your gums may bleed and may be  sensitive to brushing and flossing.  Dark spots or blotches (chloasma, mask of pregnancy) may develop on your face. This will likely fade after the baby is born.  Your menstrual periods will stop.  You may have a loss of appetite.  You may develop cravings for certain kinds of food.  You may have changes in your emotions from day to day, such as being excited to be pregnant or being concerned that something may go wrong with the pregnancy and baby.  You may have more vivid and strange dreams.  You may have changes in your hair. These can include thickening of your hair, rapid growth, and changes in texture. Some women also have hair loss during or after pregnancy, or hair that feels dry or thin. Your hair will most likely return to normal after your baby is born.  What to expect at prenatal visits During a routine prenatal visit:  You will be weighed to make sure you and the baby are growing normally.  Your blood pressure will be taken.  Your abdomen will be measured to track your baby's growth.  The fetal heartbeat will be listened to between weeks 10 and 14 of your pregnancy.  Test results from any previous visits will be discussed.  Your health care provider may ask you:  How you are feeling.  If you are feeling the baby move.  If you have had any abnormal symptoms, such as leaking fluid, bleeding, severe headaches,   or abdominal cramping.  If you are using any tobacco products, including cigarettes, chewing tobacco, and electronic cigarettes.  If you have any questions.  Other tests that may be performed during your first trimester include:  Blood tests to find your blood type and to check for the presence of any previous infections. The tests will also be used to check for low iron levels (anemia) and protein on red blood cells (Rh antibodies). Depending on your risk factors, or if you previously had diabetes during pregnancy, you may have tests to check for high blood  sugar that affects pregnant women (gestational diabetes).  Urine tests to check for infections, diabetes, or protein in the urine.  An ultrasound to confirm the proper growth and development of the baby.  Fetal screens for spinal cord problems (spina bifida) and Down syndrome.  HIV (human immunodeficiency virus) testing. Routine prenatal testing includes screening for HIV, unless you choose not to have this test.  You may need other tests to make sure you and the baby are doing well.  Follow these instructions at home: Medicines  Follow your health care provider's instructions regarding medicine use. Specific medicines may be either safe or unsafe to take during pregnancy.  Take a prenatal vitamin that contains at least 600 micrograms (mcg) of folic acid.  If you develop constipation, try taking a stool softener if your health care provider approves. Eating and drinking  Eat a balanced diet that includes fresh fruits and vegetables, whole grains, good sources of protein such as meat, eggs, or tofu, and low-fat dairy. Your health care provider will help you determine the amount of weight gain that is right for you.  Avoid raw meat and uncooked cheese. These carry germs that can cause birth defects in the baby.  Eating four or five small meals rather than three large meals a day may help relieve nausea and vomiting. If you start to feel nauseous, eating a few soda crackers can be helpful. Drinking liquids between meals, instead of during meals, also seems to help ease nausea and vomiting.  Limit foods that are high in fat and processed sugars, such as fried and sweet foods.  To prevent constipation: ? Eat foods that are high in fiber, such as fresh fruits and vegetables, whole grains, and beans. ? Drink enough fluid to keep your urine clear or pale yellow. Activity  Exercise only as directed by your health care provider. Most women can continue their usual exercise routine during  pregnancy. Try to exercise for 30 minutes at least 5 days a week. Exercising will help you: ? Control your weight. ? Stay in shape. ? Be prepared for labor and delivery.  Experiencing pain or cramping in the lower abdomen or lower back is a good sign that you should stop exercising. Check with your health care provider before continuing with normal exercises.  Try to avoid standing for long periods of time. Move your legs often if you must stand in one place for a long time.  Avoid heavy lifting.  Wear low-heeled shoes and practice good posture.  You may continue to have sex unless your health care provider tells you not to. Relieving pain and discomfort  Wear a good support bra to relieve breast tenderness.  Take warm sitz baths to soothe any pain or discomfort caused by hemorrhoids. Use hemorrhoid cream if your health care provider approves.  Rest with your legs elevated if you have leg cramps or low back pain.  If you develop   varicose veins in your legs, wear support hose. Elevate your feet for 15 minutes, 3-4 times a day. Limit salt in your diet. Prenatal care  Schedule your prenatal visits by the twelfth week of pregnancy. They are usually scheduled monthly at first, then more often in the last 2 months before delivery.  Write down your questions. Take them to your prenatal visits.  Keep all your prenatal visits as told by your health care provider. This is important. Safety  Wear your seat belt at all times when driving.  Make a list of emergency phone numbers, including numbers for family, friends, the hospital, and police and fire departments. General instructions  Ask your health care provider for a referral to a local prenatal education class. Begin classes no later than the beginning of month 6 of your pregnancy.  Ask for help if you have counseling or nutritional needs during pregnancy. Your health care provider can offer advice or refer you to specialists for help  with various needs.  Do not use hot tubs, steam rooms, or saunas.  Do not douche or use tampons or scented sanitary pads.  Do not cross your legs for long periods of time.  Avoid cat litter boxes and soil used by cats. These carry germs that can cause birth defects in the baby and possibly loss of the fetus by miscarriage or stillbirth.  Avoid all smoking, herbs, alcohol, and medicines not prescribed by your health care provider. Chemicals in these products affect the formation and growth of the baby.  Do not use any products that contain nicotine or tobacco, such as cigarettes and e-cigarettes. If you need help quitting, ask your health care provider. You may receive counseling support and other resources to help you quit.  Schedule a dentist appointment. At home, brush your teeth with a soft toothbrush and be gentle when you floss. Contact a health care provider if:  You have dizziness.  You have mild pelvic cramps, pelvic pressure, or nagging pain in the abdominal area.  You have persistent nausea, vomiting, or diarrhea.  You have a bad smelling vaginal discharge.  You have pain when you urinate.  You notice increased swelling in your face, hands, legs, or ankles.  You are exposed to fifth disease or chickenpox.  You are exposed to German measles (rubella) and have never had it. Get help right away if:  You have a fever.  You are leaking fluid from your vagina.  You have spotting or bleeding from your vagina.  You have severe abdominal cramping or pain.  You have rapid weight gain or loss.  You vomit blood or material that looks like coffee grounds.  You develop a severe headache.  You have shortness of breath.  You have any kind of trauma, such as from a fall or a car accident. Summary  The first trimester of pregnancy is from week 1 until the end of week 13 (months 1 through 3).  Your body goes through many changes during pregnancy. The changes vary from  woman to woman.  You will have routine prenatal visits. During those visits, your health care provider will examine you, discuss any test results you may have, and talk with you about how you are feeling. This information is not intended to replace advice given to you by your health care provider. Make sure you discuss any questions you have with your health care provider. Document Released: 10/15/2001 Document Revised: 10/02/2016 Document Reviewed: 10/02/2016 Elsevier Interactive Patient Education  2018 [employer]  

## 2018-07-30 NOTE — Progress Notes (Signed)
  Subjective:     Patient ID: Virginia Mills, female   DOB: 1996-05-22, 22 y.o.   MRN: [id number]  HPI Virginia Mills is a 22 year old black female in for UPT, had spotting after sex.   Review of Systems  +missed period +spotting for about 4 days, after sex, has stopped Reviewed past medical,surgical, social and family history. Reviewed medications and allergies.     Objective:   Physical Exam BP 103/63 (BP Location: Right Arm, Patient Position: Sitting, Cuff Size: Normal)   Pulse 92   Ht 5\' 5"  (1.651 m)   Wt 148 lb (67.1 kg)   LMP 06/20/2018 (Exact Date)   Breastfeeding? No   BMI 24.63 kg/m    UPT +, about 5+5 weeks, by LMP.Skin warm and dry. Neck: mid line trachea, normal thyroid, good ROM, no lymphadenopathy noted. Lungs: clear to ausculation bilaterally. Cardiovascular: regular rate and rhythm. Abdomen is soft and non tender. PHQ 9 score 6, declines meds, and says she is not suicidal or homicidal. If thinks she may want meds to let us know. She is aware that babies delivered at [facility], and that we are part of [facility].   Assessment:     1. Pregnancy test positive   2. Less than [redacted] weeks gestation of pregnancy   3. Encounter to determine fetal viability of pregnancy, single or unspecified fetus   4. Spotting affecting pregnancy in first trimester   5. Depression, unspecified depression type       Plan:     Continue PNV No sex til after [place] Get dating [place] 9/30 at 3:30 pm at [facility] Return in 4 weeks for New OB Review handouts on First trimester and by [facility]

## 2018-08-03 ENCOUNTER — Ambulatory Visit (HOSPITAL_COMMUNITY)
Admission: RE | Admit: 2018-08-03 | Discharge: 2018-08-03 | Disposition: A | Discharge status: Home / Self Care | Payer: Medicaid Other | Source: Ambulatory Visit | Attending: Adult Health | Admitting: Adult Health

## 2018-08-03 DIAGNOSIS — Z3A01 Less than 8 weeks gestation of pregnancy: Secondary | ICD-10-CM | POA: Diagnosis not present

## 2018-08-03 DIAGNOSIS — O3680X0 Pregnancy with inconclusive fetal viability, not applicable or unspecified: Secondary | ICD-10-CM | POA: Diagnosis present

## 2018-08-04 ENCOUNTER — Telehealth: Payer: Self-pay | Admitting: Advanced Practice Midwife

## 2018-08-04 NOTE — Telephone Encounter (Signed)
Patient called stating that she had an ultrasound done and they were not able to let her know how far along she is. Pt states they told her to call the office. Please contact pt

## 2018-08-04 NOTE — Telephone Encounter (Signed)
Patient informed she is 7 weeks with EDD of 03/21/19

## 2018-08-26 ENCOUNTER — Telehealth: Payer: Self-pay | Admitting: *Deleted

## 2018-08-26 NOTE — Telephone Encounter (Signed)
Pt reports feeling like her bladder is not emptying all the way. She doesn't have any pain or bleeding. Advised that since she has appt tomorrow we can check her urine for uti at that time. Patient advised to drink plenty of water.

## 2018-08-27 ENCOUNTER — Ambulatory Visit: Payer: Medicaid Other | Admitting: *Deleted

## 2018-08-27 ENCOUNTER — Encounter: Payer: Self-pay | Admitting: Advanced Practice Midwife

## 2018-08-27 ENCOUNTER — Ambulatory Visit (INDEPENDENT_AMBULATORY_CARE_PROVIDER_SITE_OTHER): Payer: Medicaid Other | Admitting: Advanced Practice Midwife

## 2018-08-27 VITALS — BP 120/65 | HR 73 | Wt 147.0 lb

## 2018-08-27 DIAGNOSIS — Z1389 Encounter for screening for other disorder: Secondary | ICD-10-CM

## 2018-08-27 DIAGNOSIS — F1290 Cannabis use, unspecified, uncomplicated: Secondary | ICD-10-CM

## 2018-08-27 DIAGNOSIS — Z3490 Encounter for supervision of normal pregnancy, unspecified, unspecified trimester: Secondary | ICD-10-CM | POA: Insufficient documentation

## 2018-08-27 DIAGNOSIS — Z3A09 9 weeks gestation of pregnancy: Secondary | ICD-10-CM

## 2018-08-27 DIAGNOSIS — Z3481 Encounter for supervision of other normal pregnancy, first trimester: Secondary | ICD-10-CM | POA: Diagnosis not present

## 2018-08-27 DIAGNOSIS — Z331 Pregnant state, incidental: Secondary | ICD-10-CM

## 2018-08-27 DIAGNOSIS — Z3682 Encounter for antenatal screening for nuchal translucency: Secondary | ICD-10-CM

## 2018-08-27 DIAGNOSIS — R768 Other specified abnormal immunological findings in serum: Secondary | ICD-10-CM

## 2018-08-27 DIAGNOSIS — F32A Depression, unspecified: Secondary | ICD-10-CM

## 2018-08-27 DIAGNOSIS — Z8759 Personal history of other complications of pregnancy, childbirth and the puerperium: Secondary | ICD-10-CM | POA: Insufficient documentation

## 2018-08-27 DIAGNOSIS — F329 Major depressive disorder, single episode, unspecified: Secondary | ICD-10-CM

## 2018-08-27 LAB — POCT URINALYSIS DIPSTICK OB
GLUCOSE, UA: NEGATIVE
Ketones, UA: NEGATIVE
LEUKOCYTES UA: NEGATIVE
Nitrite, UA: NEGATIVE
POC,PROTEIN,UA: NEGATIVE
RBC UA: NEGATIVE
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
## 2018-08-27 NOTE — Patient Instructions (Signed)
 First Trimester of Pregnancy The first trimester of pregnancy is from week 1 until the end of week 12 (months 1 through 3). A week after a sperm fertilizes an egg, the egg will implant on the wall of the uterus. This embryo will begin to develop into a baby. Genes from you and your partner are forming the baby. The female genes determine whether the baby is a boy or a girl. At 6-8 weeks, the eyes and face are formed, and the heartbeat can be seen on ultrasound. At the end of 12 weeks, all the baby's organs are formed.  Now that you are pregnant, you will want to do everything you can to have a healthy baby. Two of the most important things are to get good prenatal care and to follow your health care provider's instructions. Prenatal care is all the medical care you receive before the baby's birth. This care will help prevent, find, and treat any problems during the pregnancy and childbirth. BODY CHANGES Your body goes through many changes during pregnancy. The changes vary from woman to woman.   You may gain or lose a couple of pounds at first.  You may feel sick to your stomach (nauseous) and throw up (vomit). If the vomiting is uncontrollable, call your health care provider.  You may tire easily.  You may develop headaches that can be relieved by medicines approved by your health care provider.  You may urinate more often. Painful urination may mean you have a bladder infection.  You may develop heartburn as a result of your pregnancy.  You may develop constipation because certain hormones are causing the muscles that push waste through your intestines to slow down.  You may develop hemorrhoids or swollen, bulging veins (varicose veins).  Your breasts may begin to grow larger and become tender. Your nipples may stick out more, and the tissue that surrounds them (areola) may become darker.  Your gums may bleed and may be sensitive to brushing and flossing.  Dark spots or blotches  (chloasma, mask of pregnancy) may develop on your face. This will likely fade after the baby is born.  Your menstrual periods will stop.  You may have a loss of appetite.  You may develop cravings for certain kinds of food.  You may have changes in your emotions from day to day, such as being excited to be pregnant or being concerned that something may go wrong with the pregnancy and baby.  You may have more vivid and strange dreams.  You may have changes in your hair. These can include thickening of your hair, rapid growth, and changes in texture. Some women also have hair loss during or after pregnancy, or hair that feels dry or thin. Your hair will most likely return to normal after your baby is born. WHAT TO EXPECT AT YOUR PRENATAL VISITS During a routine prenatal visit:  You will be weighed to make sure you and the baby are growing normally.  Your blood pressure will be taken.  Your abdomen will be measured to track your baby's growth.  The fetal heartbeat will be listened to starting around week 10 or 12 of your pregnancy.  Test results from any previous visits will be discussed. Your health care provider may ask you:  How you are feeling.  If you are feeling the baby move.  If you have had any abnormal symptoms, such as leaking fluid, bleeding, severe headaches, or abdominal cramping.  If you have any questions. Other   tests that may be performed during your first trimester include:  Blood tests to find your blood type and to check for the presence of any previous infections. They will also be used to check for low iron levels (anemia) and Rh antibodies. Later in the pregnancy, blood tests for diabetes will be done along with other tests if problems develop.  Urine tests to check for infections, diabetes, or protein in the urine.  An ultrasound to confirm the proper growth and development of the baby.  An amniocentesis to check for possible genetic problems.  Fetal  screens for spina bifida and Down syndrome.  You may need other tests to make sure you and the baby are doing well. HOME CARE INSTRUCTIONS  Medicines  Follow your health care provider's instructions regarding medicine use. Specific medicines may be either safe or unsafe to take during pregnancy.  Take your prenatal vitamins as directed.  If you develop constipation, try taking a stool softener if your health care provider approves. Diet  Eat regular, well-balanced meals. Choose a variety of foods, such as meat or vegetable-based protein, fish, milk and low-fat dairy products, vegetables, fruits, and whole grain breads and cereals. Your health care provider will help you determine the amount of weight gain that is right for you.  Avoid raw meat and uncooked cheese. These carry germs that can cause birth defects in the baby.  Eating four or five small meals rather than three large meals a day may help relieve nausea and vomiting. If you start to feel nauseous, eating a few soda crackers can be helpful. Drinking liquids between meals instead of during meals also seems to help nausea and vomiting.  If you develop constipation, eat more high-fiber foods, such as fresh vegetables or fruit and whole grains. Drink enough fluids to keep your urine clear or pale yellow. Activity and Exercise  Exercise only as directed by your health care provider. Exercising will help you:  Control your weight.  Stay in shape.  Be prepared for labor and delivery.  Experiencing pain or cramping in the lower abdomen or low back is a good sign that you should stop exercising. Check with your health care provider before continuing normal exercises.  Try to avoid standing for long periods of time. Move your legs often if you must stand in one place for a long time.  Avoid heavy lifting.  Wear low-heeled shoes, and practice good posture.  You may continue to have sex unless your health care provider directs you  otherwise. Relief of Pain or Discomfort  Wear a good support bra for breast tenderness.   Take warm sitz baths to soothe any pain or discomfort caused by hemorrhoids. Use hemorrhoid cream if your health care provider approves.   Rest with your legs elevated if you have leg cramps or low back pain.  If you develop varicose veins in your legs, wear support hose. Elevate your feet for 15 minutes, 3-4 times a day. Limit salt in your diet. Prenatal Care  Schedule your prenatal visits by the twelfth week of pregnancy. They are usually scheduled monthly at first, then more often in the last 2 months before delivery.  Write down your questions. Take them to your prenatal visits.  Keep all your prenatal visits as directed by your health care provider. Safety  Wear your seat belt at all times when driving.  Make a list of emergency phone numbers, including numbers for family, friends, the hospital, and police and fire departments. General   Tips  Ask your health care provider for a referral to a local prenatal education class. Begin classes no later than at the beginning of month 6 of your pregnancy.  Ask for help if you have counseling or nutritional needs during pregnancy. Your health care provider can offer advice or refer you to specialists for help with various needs.  Do not use hot tubs, steam rooms, or saunas.  Do not douche or use tampons or scented sanitary pads.  Do not cross your legs for long periods of time.  Avoid cat litter boxes and soil used by cats. These carry germs that can cause birth defects in the baby and possibly loss of the fetus by miscarriage or stillbirth.  Avoid all smoking, herbs, alcohol, and medicines not prescribed by your health care provider. Chemicals in these affect the formation and growth of the baby.  Schedule a dentist appointment. At home, brush your teeth with a soft toothbrush and be gentle when you floss. SEEK MEDICAL CARE IF:   You have  dizziness.  You have mild pelvic cramps, pelvic pressure, or nagging pain in the abdominal area.  You have persistent nausea, vomiting, or diarrhea.  You have a bad smelling vaginal discharge.  You have pain with urination.  You notice increased swelling in your face, hands, legs, or ankles. SEEK IMMEDIATE MEDICAL CARE IF:   You have a fever.  You are leaking fluid from your vagina.  You have spotting or bleeding from your vagina.  You have severe abdominal cramping or pain.  You have rapid weight gain or loss.  You vomit blood or material that looks like coffee grounds.  You are exposed to German measles and have never had them.  You are exposed to fifth disease or chickenpox.  You develop a severe headache.  You have shortness of breath.  You have any kind of trauma, such as from a fall or a car accident. Document Released: 10/15/2001 Document Revised: 03/07/2014 Document Reviewed: 08/31/2013 [employer] Patient Information 2015 [employer]. This information is not intended to replace advice given to you by your health care provider. Make sure you discuss any questions you have with your health care provider.   Nausea & Vomiting  Have saltine crackers or pretzels by your bed and eat a few bites before you raise your head out of bed in the morning  Eat small frequent meals throughout the day instead of large meals  Drink plenty of fluids throughout the day to stay hydrated, just don't drink a lot of fluids with your meals.  This can make your stomach fill up faster making you feel sick  Do not brush your teeth right after you eat  Products with real ginger are good for nausea, like ginger ale and ginger hard candy Make sure it says made with real ginger!  Sucking on sour candy like lemon heads is also good for nausea  If your prenatal vitamins make you nauseated, take them at night so you will sleep through the nausea  Sea Bands  If you feel like you need  medicine for the nausea & vomiting please let us know  If you are unable to keep any fluids or food down please let us know   Constipation  Drink plenty of fluid, preferably water, throughout the day  Eat foods high in fiber such as fruits, vegetables, and grains  Exercise, such as walking, is a good way to keep your bowels regular  Drink warm fluids, especially warm   prune juice, or decaf coffee  Eat a 1/2 cup of real oatmeal (not instant), 1/2 cup applesauce, and 1/2-1 cup warm prune juice every day  If needed, you may take Colace (docusate sodium) stool softener once or twice a day to help keep the stool soft. If you are pregnant, wait until you are out of your first trimester (12-14 weeks of pregnancy)  If you still are having problems with constipation, you may take Miralax once daily as needed to help keep your bowels regular.  If you are pregnant, wait until you are out of your first trimester (12-14 weeks of pregnancy)  Safe Medications in Pregnancy   Acne: Benzoyl Peroxide Salicylic Acid  Backache/Headache: Tylenol: 2 regular strength every 4 hours OR              2 Extra strength every 6 hours  Colds/Coughs/Allergies: Benadryl (alcohol free) 25 mg every 6 hours as needed Breath right strips Claritin Cepacol throat lozenges Chloraseptic throat spray Cold-Eeze- up to three times per day Cough drops, alcohol free Flonase (by prescription only) Guaifenesin Mucinex Robitussin DM (plain only, alcohol free) Saline nasal spray/drops Sudafed (pseudoephedrine) & Actifed ** use only after [redacted] weeks gestation and if you do not have high blood pressure Tylenol Vicks Vaporub Zinc lozenges Zyrtec   Constipation: Colace Ducolax suppositories Fleet enema Glycerin suppositories Metamucil Milk of magnesia Miralax Senokot Smooth move tea  Diarrhea: Kaopectate Imodium A-D  *NO pepto Bismol  Hemorrhoids: Anusol Anusol HC Preparation  H Tucks  Indigestion: Tums Maalox Mylanta Zantac  Pepcid  Insomnia: Benadryl (alcohol free) 25mg every 6 hours as needed Tylenol PM Unisom, no Gelcaps  Leg Cramps: Tums MagGel  Nausea/Vomiting:  Bonine Dramamine Emetrol Ginger extract Sea bands Meclizine  Nausea medication to take during pregnancy:  Unisom (doxylamine succinate 25 mg tablets) Take one tablet daily at bedtime. If symptoms are not adequately controlled, the dose can be increased to a maximum recommended dose of two tablets daily (1/2 tablet in the morning, 1/2 tablet mid-afternoon and one at bedtime). Vitamin B6 100mg tablets. Take one tablet twice a day (up to 200 mg per day).  Skin Rashes: Aveeno products Benadryl cream or 25mg every 6 hours as needed Calamine Lotion 1% cortisone cream  Yeast infection: Gyne-lotrimin 7 Monistat 7   **If taking multiple medications, please check labels to avoid duplicating the same active ingredients **take medication as directed on the label ** Do not exceed 4000 mg of tylenol in 24 hours **Do not take medications that contain aspirin or ibuprofen      

## 2018-08-28 LAB — OBSTETRIC PANEL, INCLUDING HIV
Antibody Screen: NEGATIVE
BASOS: 0 %
Basophils Absolute: 0 10*3/uL (ref 0.0–0.2)
EOS (ABSOLUTE): 0.1 10*3/uL (ref 0.0–0.4)
EOS: 1 %
HEMATOCRIT: 37.3 % (ref 34.0–46.6)
HEMOGLOBIN: 12.2 g/dL (ref 11.1–15.9)
HIV SCREEN 4TH GENERATION: NONREACTIVE
Hepatitis B Surface Ag: NEGATIVE
IMMATURE GRANS (ABS): 0 10*3/uL (ref 0.0–0.1)
Immature Granulocytes: 0 %
Lymphocytes Absolute: 1.6 10*3/uL (ref 0.7–3.1)
Lymphs: 21 %
MCH: 28 pg (ref 26.6–33.0)
MCHC: 32.7 g/dL (ref 31.5–35.7)
MCV: 86 fL (ref 79–97)
MONOS ABS: 0.5 10*3/uL (ref 0.1–0.9)
Monocytes: 7 %
NEUTROS ABS: 5.3 10*3/uL (ref 1.4–7.0)
Neutrophils: 71 %
Platelets: 180 10*3/uL (ref 150–450)
RBC: 4.36 x10E6/uL (ref 3.77–5.28)
RDW: 12.5 % (ref 12.3–15.4)
RH TYPE: POSITIVE
RPR Ser Ql: NONREACTIVE
Rubella Antibodies, IGG: 5.06 index (ref >0.99)
WBC: 7.6 10*3/uL (ref 3.4–10.8)

## 2018-08-28 LAB — PMP SCREEN PROFILE (10S), URINE
Amphetamine Scrn, Ur: NEGATIVE ng/mL
BARBITURATE SCREEN URINE: NEGATIVE ng/mL
BENZODIAZEPINE SCREEN, URINE: NEGATIVE ng/mL
CANNABINOIDS UR QL SCN: POSITIVE ng/mL — AB
Cocaine (Metab) Scrn, Ur: NEGATIVE ng/mL
Creatinine(Crt), U: 89.7 mg/dL (ref 20.0–300.0)
Methadone Screen, Urine: NEGATIVE ng/mL
OXYCODONE+OXYMORPHONE UR QL SCN: POSITIVE ng/mL — AB
Opiate Scrn, Ur: NEGATIVE ng/mL
PH UR, DRUG SCRN: 8 (ref 4.5–8.9)
Phencyclidine Qn, Ur: NEGATIVE ng/mL
Propoxyphene Scrn, Ur: NEGATIVE ng/mL

## 2018-08-28 LAB — URINALYSIS, ROUTINE W REFLEX MICROSCOPIC
BILIRUBIN UA: NEGATIVE
Glucose, UA: NEGATIVE
Ketones, UA: NEGATIVE
LEUKOCYTES UA: NEGATIVE
Nitrite, UA: NEGATIVE
PH UA: 8.5 — AB (ref 5.0–7.5)
Protein, UA: NEGATIVE
RBC UA: NEGATIVE
Specific Gravity, UA: 1.013 (ref 1.005–1.030)
Urobilinogen, Ur: 0.2 mg/dL (ref 0.2–1.0)

## 2018-08-29 LAB — URINE CULTURE

## 2018-09-09 ENCOUNTER — Telehealth: Payer: Self-pay | Admitting: Women's Health

## 2018-09-09 ENCOUNTER — Encounter: Payer: Self-pay | Admitting: *Deleted

## 2018-09-09 NOTE — Telephone Encounter (Signed)
Paitent called stating that her Mychart keeps telling her that her labs are ready for viewing but pt states that she goes in and she can not see them. Pt would like a call back with the results of her blood work

## 2018-09-17 ENCOUNTER — Encounter: Payer: Self-pay | Admitting: Women's Health

## 2018-09-17 ENCOUNTER — Other Ambulatory Visit (HOSPITAL_COMMUNITY)
Admission: RE | Admit: 2018-09-17 | Discharge: 2018-09-17 | Disposition: A | Discharge status: Home / Self Care | Payer: Medicaid Other | Source: Ambulatory Visit | Attending: Obstetrics & Gynecology | Admitting: Obstetrics & Gynecology

## 2018-09-17 ENCOUNTER — Ambulatory Visit (INDEPENDENT_AMBULATORY_CARE_PROVIDER_SITE_OTHER): Payer: Medicaid Other

## 2018-09-17 ENCOUNTER — Telehealth: Payer: Self-pay | Admitting: *Deleted

## 2018-09-17 ENCOUNTER — Ambulatory Visit (INDEPENDENT_AMBULATORY_CARE_PROVIDER_SITE_OTHER): Payer: Medicaid Other | Admitting: Women's Health

## 2018-09-17 VITALS — BP 122/66 | HR 86 | Temp 98.6°F | Wt 145.0 lb

## 2018-09-17 DIAGNOSIS — Z124 Encounter for screening for malignant neoplasm of cervix: Secondary | ICD-10-CM

## 2018-09-17 DIAGNOSIS — R8271 Bacteriuria: Secondary | ICD-10-CM

## 2018-09-17 DIAGNOSIS — Z1389 Encounter for screening for other disorder: Secondary | ICD-10-CM

## 2018-09-17 DIAGNOSIS — Z1379 Encounter for other screening for genetic and chromosomal anomalies: Secondary | ICD-10-CM

## 2018-09-17 DIAGNOSIS — Z3481 Encounter for supervision of other normal pregnancy, first trimester: Secondary | ICD-10-CM

## 2018-09-17 DIAGNOSIS — R350 Frequency of micturition: Secondary | ICD-10-CM

## 2018-09-17 DIAGNOSIS — O9989 Other specified diseases and conditions complicating pregnancy, childbirth and the puerperium: Secondary | ICD-10-CM

## 2018-09-17 DIAGNOSIS — Z3682 Encounter for antenatal screening for nuchal translucency: Secondary | ICD-10-CM | POA: Diagnosis not present

## 2018-09-17 DIAGNOSIS — Z8759 Personal history of other complications of pregnancy, childbirth and the puerperium: Secondary | ICD-10-CM

## 2018-09-17 DIAGNOSIS — Z331 Pregnant state, incidental: Secondary | ICD-10-CM

## 2018-09-17 DIAGNOSIS — O99891 Other specified diseases and conditions complicating pregnancy: Secondary | ICD-10-CM | POA: Insufficient documentation

## 2018-09-17 DIAGNOSIS — Z3401 Encounter for supervision of normal first pregnancy, first trimester: Secondary | ICD-10-CM

## 2018-09-17 DIAGNOSIS — Z3A13 13 weeks gestation of pregnancy: Secondary | ICD-10-CM

## 2018-09-17 LAB — POCT URINALYSIS DIPSTICK OB
GLUCOSE, UA: NEGATIVE
KETONES UA: NEGATIVE
Leukocytes, UA: NEGATIVE
POC,PROTEIN,UA: NEGATIVE
RBC UA: NEGATIVE

## 2018-09-17 MED ORDER — NITROFURANTOIN MONOHYD MACRO 100 MG PO CAPS: 100.0000 mg | ORAL_CAPSULE | Freq: Two times a day (BID) | ORAL | 0 refills | Status: DC

## 2018-09-17 NOTE — Progress Notes (Signed)
US 13+4 wks,measurements c/w dates,normal ovaries bilat,anterior placenta gr 0,CRL 76.47 mm,NB present,NT 1.9 mm,fhr 153 bpm

## 2018-09-17 NOTE — Telephone Encounter (Signed)
Spoke with pt. Pt was seen this am and had a pap smear. Pt having some cramping. No bleeding or spotting. Advised can take Tylenol and push fluids. Advised can call if anything changes. Pt voiced understanding. JSY

## 2018-09-17 NOTE — Patient Instructions (Signed)
Virginia PaulaMarhonda M Mills, I greatly value your feedback.  If you receive a survey following your visit with us today, we appreciate you taking the time to fill it out.  Thanks, Joellyn HaffKim Macaila Tahir, CNM, WHNP-BC   Second Trimester of Pregnancy The second trimester is from week 14 through week 27 (months 4 through 6). The second trimester is often a time when you feel your best. Your body has adjusted to being pregnant, and you begin to feel better physically. Usually, morning sickness has lessened or quit completely, you may have more energy, and you may have an increase in appetite. The second trimester is also a time when the fetus is growing rapidly. At the end of the sixth month, the fetus is about 9 inches long and weighs about 1 pounds. You will likely begin to feel the baby move (quickening) between 16 and 20 weeks of pregnancy. Body changes during your second trimester Your body continues to go through many changes during your second trimester. The changes vary from woman to woman.  Your weight will continue to increase. You will notice your lower abdomen bulging out.  You may begin to get stretch marks on your hips, abdomen, and breasts.  You may develop headaches that can be relieved by medicines. The medicines should be approved by your health care provider.  You may urinate more often because the fetus is pressing on your bladder.  You may develop or continue to have heartburn as a result of your pregnancy.  You may develop constipation because certain hormones are causing the muscles that push waste through your intestines to slow down.  You may develop hemorrhoids or swollen, bulging veins (varicose veins).  You may have back pain. This is caused by: ? Weight gain. ? Pregnancy hormones that are relaxing the joints in your pelvis. ? A shift in weight and the muscles that support your balance.  Your breasts will continue to grow and they will continue to become tender.  Your gums may bleed  and may be sensitive to brushing and flossing.  Dark spots or blotches (chloasma, mask of pregnancy) may develop on your face. This will likely fade after the baby is born.  A dark line from your belly button to the pubic area (linea nigra) may appear. This will likely fade after the baby is born.  You may have changes in your hair. These can include thickening of your hair, rapid growth, and changes in texture. Some women also have hair loss during or after pregnancy, or hair that feels dry or thin. Your hair will most likely return to normal after your baby is born.  What to expect at prenatal visits During a routine prenatal visit:  You will be weighed to make sure you and the fetus are growing normally.  Your blood pressure will be taken.  Your abdomen will be measured to track your baby's growth.  The fetal heartbeat will be listened to.  Any test results from the previous visit will be discussed.  Your health care provider may ask you:  How you are feeling.  If you are feeling the baby move.  If you have had any abnormal symptoms, such as leaking fluid, bleeding, severe headaches, or abdominal cramping.  If you are using any tobacco products, including cigarettes, chewing tobacco, and electronic cigarettes.  If you have any questions.  Other tests that may be performed during your second trimester include:  Blood tests that check for: ? Low iron levels (anemia). ? High  blood sugar that affects pregnant women (gestational diabetes) between 3 and 28 weeks. ? Rh antibodies. This is to check for a protein on red blood cells (Rh factor).  Urine tests to check for infections, diabetes, or protein in the urine.  An ultrasound to confirm the proper growth and development of the baby.  An amniocentesis to check for possible genetic problems.  Fetal screens for spina bifida and Down syndrome.  HIV (human immunodeficiency virus) testing. Routine prenatal testing includes  screening for HIV, unless you choose not to have this test.  Follow these instructions at home: Medicines  Follow your health care provider's instructions regarding medicine use. Specific medicines may be either safe or unsafe to take during pregnancy.  Take a prenatal vitamin that contains at least 600 micrograms (mcg) of folic acid.  If you develop constipation, try taking a stool softener if your health care provider approves. Eating and drinking  Eat a balanced diet that includes fresh fruits and vegetables, whole grains, good sources of protein such as meat, eggs, or tofu, and low-fat dairy. Your health care provider will help you determine the amount of weight gain that is right for you.  Avoid raw meat and uncooked cheese. These carry germs that can cause birth defects in the baby.  If you have low calcium intake from food, talk to your health care provider about whether you should take a daily calcium supplement.  Limit foods that are high in fat and processed sugars, such as fried and sweet foods.  To prevent constipation: ? Drink enough fluid to keep your urine clear or pale yellow. ? Eat foods that are high in fiber, such as fresh fruits and vegetables, whole grains, and beans. Activity  Exercise only as directed by your health care provider. Most women can continue their usual exercise routine during pregnancy. Try to exercise for 30 minutes at least 5 days a week. Stop exercising if you experience uterine contractions.  Avoid heavy lifting, wear low heel shoes, and practice good posture.  A sexual relationship may be continued unless your health care provider directs you otherwise. Relieving pain and discomfort  Wear a good support bra to prevent discomfort from breast tenderness.  Take warm sitz baths to soothe any pain or discomfort caused by hemorrhoids. Use hemorrhoid cream if your health care provider approves.  Rest with your legs elevated if you have leg cramps  or low back pain.  If you develop varicose veins, wear support hose. Elevate your feet for 15 minutes, 3-4 times a day. Limit salt in your diet. Prenatal Care  Write down your questions. Take them to your prenatal visits.  Keep all your prenatal visits as told by your health care provider. This is important. Safety  Wear your seat belt at all times when driving.  Make a list of emergency phone numbers, including numbers for family, friends, the hospital, and police and fire departments. General instructions  Ask your health care provider for a referral to a local prenatal education class. Begin classes no later than the beginning of month 6 of your pregnancy.  Ask for help if you have counseling or nutritional needs during pregnancy. Your health care provider can offer advice or refer you to specialists for help with various needs.  Do not use hot tubs, steam rooms, or saunas.  Do not douche or use tampons or scented sanitary pads.  Do not cross your legs for long periods of time.  Avoid cat litter boxes and soil  used by cats. These carry germs that can cause birth defects in the baby and possibly loss of the fetus by miscarriage or stillbirth.  Avoid all smoking, herbs, alcohol, and unprescribed drugs. Chemicals in these products can affect the formation and growth of the baby.  Do not use any products that contain nicotine or tobacco, such as cigarettes and e-cigarettes. If you need help quitting, ask your health care provider.  Visit your dentist if you have not gone yet during your pregnancy. Use a soft toothbrush to brush your teeth and be gentle when you floss. Contact a health care provider if:  You have dizziness.  You have mild pelvic cramps, pelvic pressure, or nagging pain in the abdominal area.  You have persistent nausea, vomiting, or diarrhea.  You have a bad smelling vaginal discharge.  You have pain when you urinate. Get help right away if:  You have a  fever.  You are leaking fluid from your vagina.  You have spotting or bleeding from your vagina.  You have severe abdominal cramping or pain.  You have rapid weight gain or weight loss.  You have shortness of breath with chest pain.  You notice sudden or extreme swelling of your face, hands, ankles, feet, or legs.  You have not felt your baby move in over an hour.  You have severe headaches that do not go away when you take medicine.  You have vision changes. Summary  The second trimester is from week 14 through week 27 (months 4 through 6). It is also a time when the fetus is growing rapidly.  Your body goes through many changes during pregnancy. The changes vary from woman to woman.  Avoid all smoking, herbs, alcohol, and unprescribed drugs. These chemicals affect the formation and growth your baby.  Do not use any tobacco products, such as cigarettes, chewing tobacco, and e-cigarettes. If you need help quitting, ask your health care provider.  Contact your health care provider if you have any questions. Keep all prenatal visits as told by your health care provider. This is important. This information is not intended to replace advice given to you by your health care provider. Make sure you discuss any questions you have with your health care provider. Document Released: 10/15/2001 Document Revised: 03/28/2016 Document Reviewed: 12/22/2012 [employer] Interactive Patient Education  2017 [employer].

## 2018-09-17 NOTE — Progress Notes (Addendum)
   LOW-RISK PREGNANCY VISIT Patient name: Virginia Mills Tash MRN [id number]  Date of birth: [birth year] Chief Complaint:   Routine Prenatal Visit (NT/IT/ pap)  History of Present Illness:   Virginia Mills Serrao is a 22 y.o. [phone] female at 1466w4d with an Estimated Date of Delivery: 03/21/19 being seen today for ongoing management of a low-risk pregnancy.  Today she reports no complaints.  .  .  Movement: Absent. denies leaking of fluid. Review of Systems:   Pertinent items are noted in HPI Denies abnormal vaginal discharge w/ itching/odor/irritation, headaches, visual changes, shortness of breath, chest pain, abdominal pain, severe nausea/vomiting, or problems with urination or bowel movements unless otherwise stated above. Pertinent History Reviewed:  Reviewed past medical,surgical, social, obstetrical and family history.  Reviewed problem list, medications and allergies. Physical Assessment:   Vitals:   09/17/18 0953  BP: 122/66  Pulse: 86  Temp: 98.6 F (37 C)  Weight: 145 lb (65.8 kg)  Body mass index is 24.13 kg/[place]ills.        Physical Examination:   General appearance: Well appearing, and in no distress  Mental status: Alert, oriented to person, place, and time  Skin: Warm & dry  Cardiovascular: Normal heart rate noted  Respiratory: Normal respiratory effort, no distress  Abdomen: Soft, gravid, nontender  Pelvic: pap obtained         Extremities: Edema: None  Fetal Status: Fetal Heart Rate (bpm): 153 u/s   Movement: Absent    US 13+4 wks,measurements c/w dates,normal ovaries bilat,anterior placenta gr 0,CRL 76.47 mm,NB present,NT 1.9 mm,fhr 153 bpm  Results for orders placed or performed in visit on 09/17/18 (from the past 24 hour(s))  POC Urinalysis Dipstick OB   Collection Time: 09/17/18 10:03 AM  Result Value Ref Range   Color, UA     Clarity, UA     Glucose, UA Negative Negative   Bilirubin, UA     Ketones, UA neg    Spec Grav, UA     Blood, UA neg    pH, UA     POC,PROTEIN,UA Negative Negative, Trace, Small (1+), Moderate (2+), Large (3+), 4+   Urobilinogen, UA     Nitrite, UA postive    Leukocytes, UA Negative Negative   Appearance     Odor      Assessment & Plan:  1) Low-risk pregnancy [id number] at 1466w4d with an Estimated Date of Delivery: 03/21/19   2) H/O IUGR, efw 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 nitrofurantoin, macrocrystal-monohydrate, (MACROBID) 100 MG capsule    Sig: Take 1 capsule (100 mg total) by mouth 2 (two) times daily. X 7 days    Dispense:  14 capsule    Refill:  0    Order Specific Question:   Supervising Provider    Answer:   Lazaro ArmsEURE, LUTHER H [[id number]]   Labs/procedures today: 1st IT/NT, pap w/ gc/ct  Plan:  Continue routine obstetrical care   Reviewed: Preterm labor symptoms and general obstetric precautions including but not limited to vaginal bleeding, contractions, leaking of fluid and fetal movement were reviewed in detail with the patient.  All questions were answered  Follow-up: Return in about 3 weeks (around 10/08/2018) for LROB, 2nd IT.  Orders Placed This Encounter  Procedures  . Urine Culture  . Integrated 1  . POC Urinalysis Dipstick OB   Cheral MarkerKimberly R Nevah Dalal CNM, [facility]NP[facility] 09/17/2018 12:11 PM

## 2018-09-17 NOTE — Addendum Note (Signed)
Addended by: Shawna ClampBOOKER, Imari Reen R on: 09/17/2018 12:11 PM   Modules accepted: Orders

## 2018-09-18 LAB — CYTOLOGY - PAP
CHLAMYDIA, DNA PROBE: NEGATIVE
Diagnosis: NEGATIVE
Neisseria Gonorrhea: NEGATIVE

## 2018-09-19 LAB — INTEGRATED 1
CROWN RUMP LENGTH MAT SCREEN: 76.5 mm
Gest. Age on Collection Date: 13.4 weeks
Maternal Age at EDD: 25.1 yr
NUMBER OF FETUSES: 1
Nuchal Translucency (NT): 1.9 mm
PAPP-A VALUE: 1692.6 ng/mL
Weight: 145 [lb_av]

## 2018-09-19 LAB — URINE CULTURE

## 2018-09-29 ENCOUNTER — Telehealth: Payer: Self-pay | Admitting: *Deleted

## 2018-09-29 NOTE — Telephone Encounter (Signed)
Patient states she has had a headache despite taking extra strength Tylenol.  Informed patient that headaches in early pregnancy are common and are usually due to hormones.  Encouraged patient to push fluids, eat small frequent meals and try adding caffeine back into her diet.  Verbalized understanding and would try recommendations.

## 2018-10-08 ENCOUNTER — Ambulatory Visit (INDEPENDENT_AMBULATORY_CARE_PROVIDER_SITE_OTHER): Payer: Medicaid Other | Admitting: Obstetrics and Gynecology

## 2018-10-08 VITALS — BP 119/70 | HR 89 | Wt 143.6 lb

## 2018-10-08 DIAGNOSIS — Z1379 Encounter for other screening for genetic and chromosomal anomalies: Secondary | ICD-10-CM

## 2018-10-08 DIAGNOSIS — Z1389 Encounter for screening for other disorder: Secondary | ICD-10-CM

## 2018-10-08 DIAGNOSIS — Z331 Pregnant state, incidental: Secondary | ICD-10-CM

## 2018-10-08 DIAGNOSIS — R8271 Bacteriuria: Secondary | ICD-10-CM

## 2018-10-08 DIAGNOSIS — O99891 Other specified diseases and conditions complicating pregnancy: Secondary | ICD-10-CM

## 2018-10-08 DIAGNOSIS — Z3A16 16 weeks gestation of pregnancy: Secondary | ICD-10-CM

## 2018-10-08 DIAGNOSIS — Z3402 Encounter for supervision of normal first pregnancy, second trimester: Secondary | ICD-10-CM

## 2018-10-08 DIAGNOSIS — O9989 Other specified diseases and conditions complicating pregnancy, childbirth and the puerperium: Secondary | ICD-10-CM

## 2018-10-08 LAB — POCT URINALYSIS DIPSTICK OB
Blood, UA: NEGATIVE
Glucose, UA: NEGATIVE
Ketones, UA: NEGATIVE
Leukocytes, UA: NEGATIVE
NITRITE UA: NEGATIVE
PROTEIN: NEGATIVE

## 2018-10-08 NOTE — Progress Notes (Signed)
Patient ID: Virginia Mills, female   DOB: 25-Aug-1996, 22 y.o.   MRN: [id number]  LOW-RISK PREGNANCY VISIT Patient name: Virginia Mills MRN [id number]  Date of birth: 25-Aug-1996 Chief Complaint:   Routine Prenatal Visit (2nd IT, nausea and vomiting)  History of Present Illness:   Virginia Mills is a 22 y.o. [phone] female at 6885w4d with an Estimated Date of Delivery: 03/21/19 being seen today for ongoing management of a low-risk pregnancy.  Today she reports nausea and vomiting. This is her second child and she has a 24 year old at home. The FOB is excited, but they are not currently living together. She had her first baby with a different partner. She is planning on bottle feeding this child and is interested in Nexplanon after pregnancy.   Contractions: Not present. Vag. Bleeding: None.   . denies leaking of fluid. Review of Systems:   Pertinent items are noted in HPI Denies abnormal vaginal discharge w/ itching/odor/irritation, headaches, visual changes, shortness of breath, chest pain, abdominal pain, severe nausea/vomiting, or problems with urination or bowel movements unless otherwise stated above. Pertinent History Reviewed:  Reviewed past medical,surgical, social, obstetrical and family history.  Reviewed problem list, medications and allergies. Physical Assessment:   Vitals:   10/08/18 1519  BP: 119/70  Pulse: 89  Weight: 143 lb 9.6 oz (65.1 kg)  Body mass index is 23.9 kg/m.        Physical Examination:   General appearance: Well appearing, and in no distress  Mental status: Alert, oriented to person, place, and time  Skin: Warm & dry  Cardiovascular: Normal heart rate noted  Respiratory: Normal respiratory effort, no distress  Abdomen: Soft, gravid, nontender  Pelvic: Cervical exam deferred         Extremities: Edema: None  Fetal Status: Fetal Heart Rate (bpm): 156        Results for orders placed or performed in visit on 10/08/18 (from the past 24 hour(s))  POC  Urinalysis Dipstick OB   Collection Time: 10/08/18  3:23 PM  Result Value Ref Range   Color, UA     Clarity, UA     Glucose, UA Negative Negative   Bilirubin, UA     Ketones, UA neg    Spec Grav, UA     Blood, UA neg    pH, UA     POC,PROTEIN,UA Negative Negative, Trace, Small (1+), Moderate (2+), Large (3+), 4+   Urobilinogen, UA     Nitrite, UA neg    Leukocytes, UA Negative Negative   Appearance     Odor      Assessment & Plan:  1) Low-risk pregnancy [id number] at 4985w4d with an Estimated Date of Delivery: 03/21/19   2) H/O IUGR, efw q4wks >20wks  3) ASB> rx macrobid   Plan:  Continue routine obstetrical care  Meds: No orders of the defined types were placed in this encounter.  Labs/procedures today: 2nd IT  Follow-up: Return in about 2 weeks (around 10/22/2018) for LROB, U/S: ANATOMY.  Orders Placed This Encounter  Procedures  . Urine Culture  . INTEGRATED 2  . POC Urinalysis Dipstick OB   By signing my name below, I, Pietro CassisEmily Tufford, attest that this documentation has been prepared under the direction and in the presence of Tilda BurrowFerguson, Tywana Robotham V, MD. Electronically Signed: Pietro CassisEmily Tufford, Medical Scribe. 10/08/18. 3:40 PM.  I personally performed the services described in this documentation, which was SCRIBED in my presence. The recorded information has been reviewed and  considered accurate. It has been edited as necessary during review. Jonnie Kind, MD

## 2018-10-13 ENCOUNTER — Other Ambulatory Visit: Payer: Self-pay | Admitting: Obstetrics and Gynecology

## 2018-10-13 MED ORDER — NITROFURANTOIN MONOHYD MACRO 100 MG PO CAPS: 100.0000 mg | ORAL_CAPSULE | Freq: Two times a day (BID) | ORAL | 0 refills | Status: DC

## 2018-10-13 NOTE — Progress Notes (Signed)
Showed nitrates again today she has been previously treated for asymptomatic bacteriuria will treat with Macrodantin again today

## 2018-10-14 ENCOUNTER — Telehealth: Payer: Self-pay | Admitting: Women's Health

## 2018-10-14 LAB — URINE CULTURE

## 2018-10-14 MED ORDER — NITROFURANTOIN MONOHYD MACRO 100 MG PO CAPS: 100.0000 mg | ORAL_CAPSULE | Freq: Two times a day (BID) | ORAL | 0 refills | Status: DC

## 2018-10-14 NOTE — Addendum Note (Signed)
Addended by: Tilda BurrowFERGUSON, Arriah Wadle V on: 10/14/2018 05:32 PM   Modules accepted: Orders

## 2018-10-14 NOTE — Telephone Encounter (Signed)
Patient with questions regarding her hx IUGR. All questions answered.  Also informed urine culture positive and antibiotic sent to pharmacy.  Advised to take all of medication even if no symptoms. Pt verbalized understanding.

## 2018-10-14 NOTE — Telephone Encounter (Signed)
Patient called, stated that she was viewing her mychart and has some questions about the information in there from our office.  [phone]

## 2018-10-21 ENCOUNTER — Other Ambulatory Visit: Payer: Self-pay | Admitting: Obstetrics and Gynecology

## 2018-10-21 DIAGNOSIS — Z363 Encounter for antenatal screening for malformations: Secondary | ICD-10-CM

## 2018-10-22 ENCOUNTER — Ambulatory Visit (INDEPENDENT_AMBULATORY_CARE_PROVIDER_SITE_OTHER): Payer: Medicaid Other

## 2018-10-22 ENCOUNTER — Ambulatory Visit (INDEPENDENT_AMBULATORY_CARE_PROVIDER_SITE_OTHER): Payer: Medicaid Other | Admitting: Obstetrics and Gynecology

## 2018-10-22 ENCOUNTER — Telehealth: Payer: Self-pay | Admitting: Obstetrics and Gynecology

## 2018-10-22 VITALS — BP 113/63 | HR 82 | Wt 145.6 lb

## 2018-10-22 DIAGNOSIS — R8271 Bacteriuria: Secondary | ICD-10-CM

## 2018-10-22 DIAGNOSIS — Z363 Encounter for antenatal screening for malformations: Secondary | ICD-10-CM | POA: Diagnosis not present

## 2018-10-22 DIAGNOSIS — Z8759 Personal history of other complications of pregnancy, childbirth and the puerperium: Secondary | ICD-10-CM

## 2018-10-22 DIAGNOSIS — Z1389 Encounter for screening for other disorder: Secondary | ICD-10-CM

## 2018-10-22 DIAGNOSIS — O9989 Other specified diseases and conditions complicating pregnancy, childbirth and the puerperium: Secondary | ICD-10-CM

## 2018-10-22 DIAGNOSIS — Z331 Pregnant state, incidental: Secondary | ICD-10-CM

## 2018-10-22 DIAGNOSIS — Z3402 Encounter for supervision of normal first pregnancy, second trimester: Secondary | ICD-10-CM

## 2018-10-22 DIAGNOSIS — O99891 Other specified diseases and conditions complicating pregnancy: Secondary | ICD-10-CM

## 2018-10-22 DIAGNOSIS — Z3A18 18 weeks gestation of pregnancy: Secondary | ICD-10-CM

## 2018-10-22 NOTE — Progress Notes (Signed)
US 18+4 wks,cephalic,anterior placenta gr 0,normal ovaries bilat,svp of fluid 4 cm,fhr 150 bpm,cx 3.4 cm,efw 273 g,anatomy complete,no obvious abnormalities

## 2018-10-22 NOTE — Progress Notes (Signed)
Patient ID: Virginia Mills, female   DOB: 1996/01/25, 22 y.o.   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 [phone] female at 1560w4d with an Estimated Date of Delivery: 03/21/19 being seen today for ongoing management of a low-risk pregnancy. 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 Went back 2 times after surgery and cardiology didn't need to see baby againaa  Says she has had cought for 3 days with no fever. Today she reports no complaints.  .  .   . denies leaking of fluid. Review of Systems:   Pertinent items are noted in HPI Denies abnormal vaginal discharge w/ itching/odor/irritation, headaches, visual changes, shortness of breath, chest pain, abdominal pain, severe nausea/vomiting, or problems with urination or bowel movements unless otherwise stated above. Pertinent History Reviewed:  Reviewed past medical,surgical, social, obstetrical and family history.  Reviewed problem list, medications and allergies. Physical Assessment:  There were no vitals filed for this visit.There is no height or weight on file to calculate BMI.        Physical Examination:   General appearance: Well appearing, and in no distress  Mental status: Alert, oriented to person, place, and time  Skin: Warm & dry  Cardiovascular: Normal heart rate noted  Respiratory: Normal respiratory effort, no distress  Abdomen: Soft, gravid, nontender  Pelvic: Cervical exam deferred         Extremities:    Fetal Status: Fetal Heart Rate (bpm): 150 u/s        No results found for this or any previous visit (from the past 24 hour(s)).  Assessment & Plan:  1) Low-risk pregnancy [id number] at 4760w4d with an Estimated Date of Delivery: 03/21/19      Meds: No orders of the defined types were placed in this encounter.  Labs/procedures today: US 18+4 wks,cephalic,anterior placenta gr 0,normal ovaries bilat,svp of fluid 4 cm,fhr 150 bpm,cx 3.4 cm,efw 273 g,anatomy complete,no obvious abnormalities   Plan:   Urine cultures  Continue Robitussin DM 24 weeks referral Dr. Mayer Camelatum pediatric cardiologist @ [phone] [phone].  Follow-up: No follow-ups on file.  Orders Placed This Encounter  Procedures  . Urine Culture  . POC Urinalysis Dipstick OB   By signing my name below, I, Arnette NorrisMari Johnson, attest that this documentation has been prepared under the direction and in the presence of Tilda BurrowFerguson, Lareen Mullings V, MD. Electronically Signed: Arnette NorrisMari Johnson Medical Scribe. 10/22/18. 2:24 PM.  I personally performed the services described in this documentation, which was SCRIBED in my presence. The recorded information has been reviewed and considered accurate. It has been edited as necessary during review. Tilda BurrowJohn V Rishawn Walck, MD

## 2018-10-22 NOTE — Addendum Note (Signed)
Addended by: Tilda BurrowFERGUSON, Bracy Pepper V on: 10/22/2018 03:21 PM   Modules accepted: Level of Service

## 2018-10-22 NOTE — Telephone Encounter (Signed)
Pt referred to Dr Mayer Camelatum of [facility].[phone]

## 2018-10-24 LAB — URINE CULTURE

## 2018-11-04 NOTE — L&D Delivery Note (Signed)
Patient is a 23 y.o. now G4P2 s/p NSVD at [redacted]w[redacted]d, who was admitted for SROM @ 0215 on 03/15/2019.  She progressed with augmentation (Pitocin) to complete and pushed 8 minutes to delivery of head at [facility] position. 1x nuchal cord easily reduced over head. Maternal pushing efforts stopped after delivery of head, shoulders behind pubic bone. Encouraged mother to push, McRoberts performed. No delivery of shoulders, suprapubic pressure performed for delivery. No spontaneous cry after delivery. Cord clamped by CNM and cut by CNM within 30 seconds of delivery and baby taken to warmer for stimulation, cord gas collected. Placenta intact and spontaneous, bleeding minimal. Mom and baby stable prior to transfer to postpartum. She plans on breastfeeding. She is unsure of method for birth control.  Delivery Note At 10:57 AM a viable and healthy female was delivered via Vaginal, Spontaneous (Presentation: ROA).  APGAR: 6, 9; weight 6 lb 14.8 oz (3140 g).   Placenta intact and spontaneous, bleeding minimal, sent to pathology. 3VCord:  with the following complications: shoulder dystocia .  Cord pH: pending  Anesthesia:  Epidural Episiotomy: None Lacerations: None Suture Repair: None Est. Blood Loss (mL):  258  Mom to postpartum.  Baby to Couplet care / Skin to Skin.  Sharyon Cable CNM 03/15/2019, 11:16 AM

## 2018-11-19 ENCOUNTER — Encounter: Payer: Medicaid Other | Admitting: Family Medicine

## 2018-11-19 ENCOUNTER — Encounter: Payer: Self-pay | Admitting: *Deleted

## 2018-11-30 ENCOUNTER — Encounter: Payer: Medicaid Other | Admitting: Obstetrics & Gynecology

## 2018-12-07 ENCOUNTER — Encounter: Payer: Medicaid Other | Admitting: Obstetrics & Gynecology

## 2018-12-14 ENCOUNTER — Ambulatory Visit (INDEPENDENT_AMBULATORY_CARE_PROVIDER_SITE_OTHER): Payer: Medicaid Other | Admitting: Obstetrics & Gynecology

## 2018-12-14 ENCOUNTER — Encounter: Payer: Self-pay | Admitting: Obstetrics & Gynecology

## 2018-12-14 ENCOUNTER — Other Ambulatory Visit: Payer: Self-pay

## 2018-12-14 VITALS — Wt 148.0 lb

## 2018-12-14 DIAGNOSIS — Z3A26 26 weeks gestation of pregnancy: Secondary | ICD-10-CM

## 2018-12-14 DIAGNOSIS — Z1389 Encounter for screening for other disorder: Secondary | ICD-10-CM

## 2018-12-14 DIAGNOSIS — B9689 Other specified bacterial agents as the cause of diseases classified elsewhere: Secondary | ICD-10-CM

## 2018-12-14 DIAGNOSIS — Z331 Pregnant state, incidental: Secondary | ICD-10-CM

## 2018-12-14 DIAGNOSIS — N760 Acute vaginitis: Secondary | ICD-10-CM

## 2018-12-14 DIAGNOSIS — Z3402 Encounter for supervision of normal first pregnancy, second trimester: Secondary | ICD-10-CM

## 2018-12-14 LAB — POCT URINALYSIS DIPSTICK OB
Blood, UA: NEGATIVE
Glucose, UA: NEGATIVE
Ketones, UA: NEGATIVE
LEUKOCYTES UA: NEGATIVE
NITRITE UA: NEGATIVE
PROTEIN: NEGATIVE

## 2018-12-14 MED ORDER — METRONIDAZOLE 0.75 % VA GEL: VAGINAL | 0 refills | Status: DC

## 2018-12-14 NOTE — Progress Notes (Signed)
   LOW-RISK PREGNANCY VISIT Patient name: Virginia Mills MRN [id number]  Date of birth: [birth year] Chief Complaint:   Routine Prenatal Visit (c/o discharge with odor)  History of Present Illness:   Virginia Mills is a 23 y.o. [phone] female at [redacted]w[redacted]d with an Estimated Date of Delivery: 03/21/19 being seen today for ongoing management of a low-risk pregnancy.  Today she reports malodorous vaginal discharge. Contractions: Not present. Vag. Bleeding: None.  Movement: Present. denies leaking of fluid. Review of Systems:   Pertinent items are noted in HPI Denies abnormal vaginal discharge w/ itching/odor/irritation, headaches, visual changes, shortness of breath, chest pain, abdominal pain, severe nausea/vomiting, or problems with urination or bowel movements unless otherwise stated above. Pertinent History Reviewed:  Reviewed past medical,surgical, social, obstetrical and family history.  Reviewed problem list, medications and allergies. Physical Assessment:   Vitals:   12/14/18 1430  Weight: 148 lb (67.1 kg)  Body mass index is 24.63 kg/m.        Physical Examination:   General appearance: Well appearing, and in no distress  Mental status: Alert, oriented to person, place, and time  Skin: Warm & dry  Cardiovascular: Normal heart rate noted  Respiratory: Normal respiratory effort, no distress  Abdomen: Soft, gravid, nontender  Pelvic: Cervical exam deferred         Extremities: Edema: None  Fetal Status:     Movement: Present    Results for orders placed or performed in visit on 12/14/18 (from the past 24 hour(s))  POC Urinalysis Dipstick OB   Collection Time: 12/14/18  2:34 PM  Result Value Ref Range   Color, UA     Clarity, UA     Glucose, UA Negative Negative   Bilirubin, UA     Ketones, UA neg    Spec Grav, UA     Blood, UA neg    pH, UA     POC,PROTEIN,UA Negative Negative, Trace, Small (1+), Moderate (2+), Large (3+), 4+   Urobilinogen, UA     Nitrite, UA neg    Leukocytes, UA Negative Negative   Appearance     Odor      Assessment & Plan:  1) Low-risk pregnancy [id number] at [redacted]w[redacted]d with an Estimated Date of Delivery: 03/21/19   2) BV  3)Hx of VSD with repair to schedule fetal ECHO at [facility] in [place]   Meds:  Meds ordered this encounter  Medications  . metroNIDAZOLE (METROGEL VAGINAL) 0.75 % vaginal gel    Sig: Nightly x 5 nights    Dispense:  70 g    Refill:  0   Labs/procedures today: wet prep  Plan:  Continue routine obstetrical care   Reviewed: Preterm labor symptoms and general obstetric precautions including but not limited to vaginal bleeding, contractions, leaking of fluid and fetal movement were reviewed in detail with the patient.  All questions were answered  Follow-up: Return in about 2 weeks (around 12/28/2018) for PN2, , LROB.  Orders Placed This Encounter  Procedures  . POC Urinalysis Dipstick OB   Lazaro Arms  12/14/2018 2:52 PM

## 2018-12-15 ENCOUNTER — Telehealth: Payer: Self-pay | Admitting: *Deleted

## 2018-12-15 NOTE — Telephone Encounter (Signed)
Referral sent to [facility] for fetal echo.

## 2018-12-28 ENCOUNTER — Encounter: Payer: Medicaid Other | Admitting: Obstetrics and Gynecology

## 2018-12-28 ENCOUNTER — Encounter: Payer: Self-pay | Admitting: Obstetrics and Gynecology

## 2018-12-28 ENCOUNTER — Other Ambulatory Visit: Payer: Medicaid Other

## 2019-01-15 ENCOUNTER — Other Ambulatory Visit: Payer: Self-pay

## 2019-01-15 ENCOUNTER — Encounter: Payer: Self-pay | Admitting: Advanced Practice Midwife

## 2019-01-15 ENCOUNTER — Telehealth: Payer: Self-pay | Admitting: *Deleted

## 2019-01-15 ENCOUNTER — Ambulatory Visit (INDEPENDENT_AMBULATORY_CARE_PROVIDER_SITE_OTHER): Payer: Medicaid Other | Admitting: Advanced Practice Midwife

## 2019-01-15 ENCOUNTER — Other Ambulatory Visit: Payer: Medicaid Other

## 2019-01-15 VITALS — BP 130/80 | HR 69 | Wt 146.6 lb

## 2019-01-15 DIAGNOSIS — R6889 Other general symptoms and signs: Secondary | ICD-10-CM

## 2019-01-15 DIAGNOSIS — Z3A30 30 weeks gestation of pregnancy: Secondary | ICD-10-CM

## 2019-01-15 DIAGNOSIS — Z3402 Encounter for supervision of normal first pregnancy, second trimester: Secondary | ICD-10-CM

## 2019-01-15 DIAGNOSIS — Z331 Pregnant state, incidental: Secondary | ICD-10-CM

## 2019-01-15 DIAGNOSIS — Z8759 Personal history of other complications of pregnancy, childbirth and the puerperium: Secondary | ICD-10-CM

## 2019-01-15 DIAGNOSIS — Z3403 Encounter for supervision of normal first pregnancy, third trimester: Secondary | ICD-10-CM

## 2019-01-15 DIAGNOSIS — Z1389 Encounter for screening for other disorder: Secondary | ICD-10-CM

## 2019-01-15 LAB — INFLUENZA A AND B, RT PCR
Influenza A PCR: NEGATIVE
Influenza B PCR: NEGATIVE

## 2019-01-15 MED ORDER — BENZONATATE 200 MG PO CAPS: 200.0000 mg | ORAL_CAPSULE | Freq: Three times a day (TID) | ORAL | 1 refills | Status: DC | PRN

## 2019-01-15 NOTE — Progress Notes (Signed)
Flu swab collected

## 2019-01-15 NOTE — Telephone Encounter (Signed)
Fax resent to [facility]

## 2019-01-15 NOTE — Progress Notes (Signed)
  [id number] [redacted]w[redacted]d Estimated Date of Delivery: 03/21/19  Blood pressure 130/80, pulse 69, weight 146 lb 9.6 oz (66.5 kg), last menstrual period 06/20/2018.   BP weight and urine results all reviewed and noted.  Please refer to the obstetrical flow sheet for the fundal height and fetal heart rate documentation:  Patient reports good fetal movement, denies any bleeding and no rupture of membranes symptoms or regular contractions. Patient started having sore throat, 102 fever for one day, body aches a week ago. Those sx are gone, now just a cough, some sore throat, taste and smell diminished.   All questions were answered.   Physical Assessment:   Vitals:   01/15/19 0906  BP: 130/80  Pulse: 69  Weight: 146 lb 9.6 oz (66.5 kg)  Body mass index is 24.4 kg/m.        Physical Examination:   General appearance: Well appearing, and in no distress.   Mental status: Alert, oriented to person, place, and time  Skin: Warm & dry  Cardiovascular: Normal heart rate noted  Respiratory: Normal respiratory effort, no distress  Dry cough  Abdomen: Soft, gravid, nontender  Pelvic: Cervical exam deferred         Extremities: Edema: None  Fetal Status: Fetal Heart Rate (bpm): 148   Movement: Present    Results for orders placed or performed in visit on 01/15/19 (from the past 24 hour(s))  Influenza A and B, RT PCR   Collection Time: 01/15/19  9:52 AM  Result Value Ref Range   Influenza A PCR Negative Negative   Influenza B PCR Negative Negative     Orders Placed This Encounter  Procedures  . Influenza A and B, RT PCR  . Novel Coronavirus 2019  . POC Urinalysis Dipstick OB    Plan:  Continued routine obstetrical care, if flu swab neg, check for coronavirus per algorhithm. Pt given general transmission precautions, doubt flu or coronovirus.  Still hasn't heard from [facility] # verified, pt says voicemail is set up in case she misses a call. Will resend request.   Return in about  2 weeks (around 01/29/2019) for LROB. EFW DT HX IUGR

## 2019-01-16 LAB — RPR: RPR Ser Ql: NONREACTIVE

## 2019-01-16 LAB — CBC
Hematocrit: 31.1 % — ABNORMAL LOW (ref 34.0–46.6)
Hemoglobin: 10.3 g/dL — ABNORMAL LOW (ref 11.1–15.9)
MCH: 27.8 pg (ref 26.6–33.0)
MCHC: 33.1 g/dL (ref 31.5–35.7)
MCV: 84 fL (ref 79–97)
Platelets: 170 10*3/uL (ref 150–450)
RBC: 3.7 x10E6/uL — ABNORMAL LOW (ref 3.77–5.28)
RDW: 12.7 % (ref 11.7–15.4)
WBC: 7.7 10*3/uL (ref 3.4–10.8)

## 2019-01-16 LAB — HIV ANTIBODY (ROUTINE TESTING W REFLEX): HIV Screen 4th Generation wRfx: NONREACTIVE

## 2019-01-16 LAB — ANTIBODY SCREEN: Antibody Screen: NEGATIVE

## 2019-01-16 LAB — GLUCOSE TOLERANCE, 2 HOURS W/ 1HR
GLUCOSE, 1 HOUR: 110 mg/dL (ref 65–179)
GLUCOSE, 2 HOUR: 118 mg/dL (ref 65–152)
Glucose, Fasting: 91 mg/dL (ref 65–91)

## 2019-01-17 ENCOUNTER — Other Ambulatory Visit: Payer: Self-pay | Admitting: Obstetrics and Gynecology

## 2019-01-18 ENCOUNTER — Telehealth: Payer: Self-pay | Admitting: Women's Health

## 2019-01-18 ENCOUNTER — Telehealth: Payer: Self-pay | Admitting: *Deleted

## 2019-01-18 LAB — NOVEL CORONAVIRUS, NAA: SARS-CoV-2, NAA: NOT DETECTED

## 2019-01-18 NOTE — Telephone Encounter (Signed)
Patient states she has a knot in her left breast, almost feels like a clogged milk duct.  She is leaking milk from one breast and the other one feels full.  Asking if she can use a breast pump to get some of the milk out.  I advised against this to her as this can initiate contractions.  Encouraged patient to wear a tight fitting bra along with using cold cabbage leaves to help.  If she develops a fever, to let us know.  Verbalized understanding.

## 2019-01-18 NOTE — Telephone Encounter (Signed)
Ob pt /30 weeks/ having issues with breast she needs to talk to nurse about

## 2019-01-19 ENCOUNTER — Encounter: Payer: Self-pay | Admitting: Advanced Practice Midwife

## 2019-01-19 ENCOUNTER — Telehealth: Payer: Self-pay | Admitting: *Deleted

## 2019-01-19 ENCOUNTER — Telehealth: Payer: Self-pay | Admitting: Women's Health

## 2019-01-19 NOTE — Telephone Encounter (Signed)
Patient states she feels like the milk duct is getting fuller, more painful and maybe infected.  No fever at this time.  Will w/i tomorrow morning for eval. Pt verbalized understanding.

## 2019-01-19 NOTE — Telephone Encounter (Signed)
Please call pt about her breast

## 2019-01-19 NOTE — Progress Notes (Signed)
Dr. Heather Roberts [phone]) from [facility] called. Pt has missed 2 appointments for fetal echo (pt had told me that she had never heard from them to schedule), had appt scheduled for tomorrow. In leiu of coronavirus, Dr. Fredna Dow that unless there is concern from [place] w/this pg, echo will not change management at all, and any issues would be picked up by peds after delivery. Pt aware.

## 2019-01-20 ENCOUNTER — Ambulatory Visit (INDEPENDENT_AMBULATORY_CARE_PROVIDER_SITE_OTHER): Payer: Medicaid Other | Admitting: Women's Health

## 2019-01-20 ENCOUNTER — Other Ambulatory Visit: Payer: Self-pay

## 2019-01-20 ENCOUNTER — Encounter: Payer: Self-pay | Admitting: Women's Health

## 2019-01-20 VITALS — BP 122/70 | HR 111 | Wt 148.0 lb

## 2019-01-20 DIAGNOSIS — Z3483 Encounter for supervision of other normal pregnancy, third trimester: Secondary | ICD-10-CM

## 2019-01-20 DIAGNOSIS — N611 Abscess of the breast and nipple: Secondary | ICD-10-CM

## 2019-01-20 DIAGNOSIS — Z3A31 31 weeks gestation of pregnancy: Secondary | ICD-10-CM

## 2019-01-20 MED ORDER — AMOXICILLIN-POT CLAVULANATE 875-125 MG PO TABS: 1.0000 | ORAL_TABLET | Freq: Two times a day (BID) | ORAL | 0 refills | Status: DC

## 2019-01-20 MED ORDER — TRAMADOL HCL 50 MG PO TABS: 50.0000 mg | ORAL_TABLET | Freq: Four times a day (QID) | ORAL | 0 refills | Status: DC | PRN

## 2019-01-20 NOTE — Progress Notes (Signed)
   Work-in GYN VISIT Patient name: Virginia Mills MRN [id number]  Date of birth: [birth year] Chief Complaint:   Breast Pain (left side; c/o cough and chest tightness)  History of Present Illness:   Virginia Mills is a 23 y.o. [phone] African American female at [redacted]w[redacted]d being seen today as work-in for report of Rt breast pain and enlarging lump x 3 days. Has been using apap w/o much relief, warm showers, etc. Hurts so bad she can't wear shirt. Sharp shooting pains.     Cold x week or so, had neg flu & coronavirus. Really just has lingering cough now.  Good fm, no uc's/lof/vb.   Patient's last menstrual period was 06/20/2018 (exact date). Review of Systems:   Pertinent items are noted in HPI Denies fever/chills, dizziness, headaches, visual disturbances, fatigue, shortness of breath, chest pain, abdominal pain, vomiting, abnormal vaginal discharge/itching/odor/irritation, problems with periods, bowel movements, urination, or intercourse unless otherwise stated above.  Pertinent History Reviewed:  Reviewed past medical,surgical, social, obstetrical and family history.  Reviewed problem list, medications and allergies. Physical Assessment:   Vitals:   01/20/19 1024  BP: 122/70  Pulse: (!) 111  Weight: 148 lb (67.1 kg)  Body mass index is 24.63 kg/m.       Physical Examination:   General appearance: alert, well appearing, and in no distress  Mental status: alert, oriented to person, place, and time  Skin: warm & dry   Cardiovascular: normal heart rate noted  Respiratory: normal respiratory effort, no distress  Breasts - Lt breast normal, Rt breast w/ 4x4cm retroareolar abscess extending down at 6 o'clock, indurated, erythematous, very tender to touch. Co-exam w/ Dr. Alysia Penna  Abdomen: soft, non-tender   Pelvic: examination not indicated  Extremities: no edema   No results found for this or any previous visit (from the past 24 hour(s)).  Assessment & Plan:  1) [redacted]w[redacted]d pregnant  2) Rt  breast abscess> Per Dr. Alysia Penna augmentin BID x 10d, f/u in 2d if not improving consider breast u/s and referral to general surgery. For now cold cabbage leaves, ice, etc. Rx ultram prn.   Meds:  Meds ordered this encounter  Medications  . amoxicillin-clavulanate (AUGMENTIN) 875-125 MG tablet    Sig: Take 1 tablet by mouth 2 (two) times daily. X 10 days    Dispense:  20 tablet    Refill:  0    Order Specific Question:   Supervising Provider    Answer:   Despina Hidden, LUTHER H [[id number]]  . traMADol (ULTRAM) 50 MG tablet    Sig: Take 1 tablet (50 mg total) by mouth every 6 (six) hours as needed.    Dispense:  10 tablet    Refill:  0    Order Specific Question:   Supervising Provider    Answer:   Despina Hidden, LUTHER H [[id number]]    No orders of the defined types were placed in this encounter.   Return for Friday for f/u.  Cheral Marker CNM, [facility] 01/20/2019 12:58 PM

## 2019-01-22 ENCOUNTER — Encounter (HOSPITAL_COMMUNITY): Payer: Self-pay | Admitting: Emergency Medicine

## 2019-01-22 ENCOUNTER — Other Ambulatory Visit: Payer: Self-pay

## 2019-01-22 ENCOUNTER — Ambulatory Visit (INDEPENDENT_AMBULATORY_CARE_PROVIDER_SITE_OTHER): Payer: Medicaid Other | Admitting: Women's Health

## 2019-01-22 ENCOUNTER — Encounter: Payer: Self-pay | Admitting: Women's Health

## 2019-01-22 ENCOUNTER — Emergency Department (HOSPITAL_COMMUNITY)
Admission: EM | Admit: 2019-01-22 | Discharge: 2019-01-22 | Disposition: A | Discharge status: Home / Self Care | Payer: Medicaid Other | Attending: General Surgery | Admitting: General Surgery

## 2019-01-22 ENCOUNTER — Telehealth: Payer: Self-pay | Admitting: Women's Health

## 2019-01-22 VITALS — BP 113/73 | HR 77 | Temp 98.5°F | Wt 144.5 lb

## 2019-01-22 DIAGNOSIS — N611 Abscess of the breast and nipple: Secondary | ICD-10-CM

## 2019-01-22 DIAGNOSIS — O91113 Abscess of breast associated with pregnancy, third trimester: Secondary | ICD-10-CM | POA: Diagnosis not present

## 2019-01-22 DIAGNOSIS — Z87891 Personal history of nicotine dependence: Secondary | ICD-10-CM | POA: Diagnosis not present

## 2019-01-22 DIAGNOSIS — Z331 Pregnant state, incidental: Secondary | ICD-10-CM

## 2019-01-22 DIAGNOSIS — Z1389 Encounter for screening for other disorder: Secondary | ICD-10-CM

## 2019-01-22 DIAGNOSIS — Z3A31 31 weeks gestation of pregnancy: Secondary | ICD-10-CM

## 2019-01-22 DIAGNOSIS — O26893 Other specified pregnancy related conditions, third trimester: Secondary | ICD-10-CM

## 2019-01-22 LAB — POCT URINALYSIS DIPSTICK OB
Blood, UA: NEGATIVE
Glucose, UA: NEGATIVE
Ketones, UA: NEGATIVE
Leukocytes, UA: NEGATIVE
NITRITE UA: NEGATIVE
PROTEIN: NEGATIVE

## 2019-01-22 LAB — BASIC METABOLIC PANEL
Anion gap: 9 (ref 5–15)
BUN: 9 mg/dL (ref 6–20)
CO2: 21 mmol/L — ABNORMAL LOW (ref 22–32)
Calcium: 8.4 mg/dL — ABNORMAL LOW (ref 8.9–10.3)
Chloride: 102 mmol/L (ref 98–111)
Creatinine, Ser: 0.52 mg/dL (ref 0.44–1.00)
GFR calc non Af Amer: >60 mL/min (ref >60)
Glucose, Bld: 71 mg/dL (ref 70–99)
Potassium: 3.7 mmol/L (ref 3.5–5.1)
Sodium: 132 mmol/L — ABNORMAL LOW (ref 135–145)

## 2019-01-22 LAB — CBC WITH DIFFERENTIAL/PLATELET
Abs Immature Granulocytes: 0.19 10*3/uL — ABNORMAL HIGH (ref 0.00–0.07)
Basophils Absolute: 0 10*3/uL (ref 0.0–0.1)
Basophils Relative: 0 %
Eosinophils Absolute: 0.1 10*3/uL (ref 0.0–0.5)
Eosinophils Relative: 1 %
HCT: 31.6 % — ABNORMAL LOW (ref 36.0–46.0)
Hemoglobin: 10.2 g/dL — ABNORMAL LOW (ref 12.0–15.0)
Immature Granulocytes: 2 %
Lymphocytes Relative: 13 %
Lymphs Abs: 1.6 10*3/uL (ref 0.7–4.0)
MCH: 27.8 pg (ref 26.0–34.0)
MCHC: 32.3 g/dL (ref 30.0–36.0)
MCV: 86.1 fL (ref 80.0–100.0)
Monocytes Absolute: 1 10*3/uL (ref 0.1–1.0)
Monocytes Relative: 8 %
Neutro Abs: 9.4 10*3/uL — ABNORMAL HIGH (ref 1.7–7.7)
Neutrophils Relative %: 76 %
Platelets: 213 10*3/uL (ref 150–400)
RBC: 3.67 MIL/uL — ABNORMAL LOW (ref 3.87–5.11)
RDW: 13.8 % (ref 11.5–15.5)
WBC: 12.3 10*3/uL — ABNORMAL HIGH (ref 4.0–10.5)
nRBC: 0 % (ref 0.0–0.2)

## 2019-01-22 MED ORDER — MORPHINE SULFATE (PF) 4 MG/ML IV SOLN
4.0000 mg | Freq: Once | INTRAVENOUS | Status: AC
  Administered 2019-01-22: 4 mg via INTRAVENOUS
  Filled 2019-01-22: qty 1

## 2019-01-22 MED ORDER — LIDOCAINE HCL (PF) 1 % IJ SOLN
INTRAMUSCULAR | Status: AC
  Filled 2019-01-22: qty 4

## 2019-01-22 MED ORDER — TRAMADOL HCL 50 MG PO TABS: 50.0000 mg | ORAL_TABLET | Freq: Four times a day (QID) | ORAL | 0 refills | Status: DC | PRN

## 2019-01-22 MED ORDER — LIDOCAINE HCL (PF) 1 % IJ SOLN
5.0000 mL | Freq: Once | INTRAMUSCULAR | Status: AC
  Administered 2019-01-22: 4 mL via INTRADERMAL
  Filled 2019-01-22: qty 6

## 2019-01-22 MED ORDER — SODIUM CHLORIDE 0.9 % IV BOLUS
500.0000 mL | Freq: Once | INTRAVENOUS | Status: AC
  Administered 2019-01-22: 500 mL via INTRAVENOUS

## 2019-01-22 NOTE — Discharge Instructions (Signed)
Dressing Instructions:  Keep drain in place. Shower as per your regular routine.  Change your dressing as needed. Expect more bloody, purulent drain in the next few days and this will taper.  You can use pads instead of gauze to collection the drainage (this is cheaper option).  Wear a loose fitting bra or cover.  Take your augmentin as prescribed. Take tylenol as needed for pain and the tramadol for severe or breakthrough pain.   Contact Information: If you have questions or concerns, please call our office, [phone], Monday- Thursday 8AM-5PM and Friday 8AM-12Noon.  If it is after hours or on the weekend, please call [facility], [phone], and ask to speak to the surgeon on call for Dr. Henreitta Leber at [facility].    Skin Abscess  A skin abscess is an infected area of your skin that contains pus and other material. An abscess can happen in any part of your body. Some abscesses break open (rupture) on their own. Most continue to get worse unless they are treated. The infection can spread deeper into the body and into your blood, which can make you feel sick. A skin abscess is caused by germs that enter the skin through a cut or scrape. It can also be caused by blocked oil and sweat glands or infected hair follicles. This condition is usually treated by:  Draining the pus.  Taking antibiotic medicines.  Placing a warm, wet washcloth over the abscess. Follow these instructions at home: Medicines   Take over-the-counter and prescription medicines only as told by your doctor.  If you were prescribed an antibiotic medicine, take it as told by your doctor. Do not stop taking the antibiotic even if you start to feel better. Abscess care   If you have an abscess that has not drained, place a warm, clean, wet washcloth over the abscess several times a day. Do this as told by your doctor.  Follow instructions from your doctor about how to take care of your abscess. Make sure  you: ? Cover the abscess with a bandage (dressing). ? Change your bandage or gauze as told by your doctor. ? Wash your hands with soap and water before you change the bandage or gauze. If you cannot use soap and water, use hand sanitizer.  Check your abscess every day for signs that the infection is getting worse. Check for: ? More redness, swelling, or pain. ? More fluid or blood. ? Warmth. ? More pus or a bad smell. General instructions  To avoid spreading the infection: ? Do not share personal care items, towels, or hot tubs with others. ? Avoid making skin-to-skin contact with other people.  Keep all follow-up visits as told by your doctor. This is important. Contact a doctor if:  You have more redness, swelling, or pain around your abscess.  You have more fluid or blood coming from your abscess.  Your abscess feels warm when you touch it.  You have more pus or a bad smell coming from your abscess.  You have a fever.  Your muscles ache.  You have chills.  You feel sick. Get help right away if:  You have very bad (severe) pain.  You see red streaks on your skin spreading away from the abscess. Summary  A skin abscess is an infected area of your skin that contains pus and other material.  The abscess is caused by germs that enter the skin through a cut or scrape. It can also be caused by  blocked oil and sweat glands or infected hair follicles.  Follow your doctor's instructions on caring for your abscess, taking medicines, preventing infections, and keeping follow-up visits. This information is not intended to replace advice given to you by your health care provider. Make sure you discuss any questions you have with your health care provider. Document Released: 04/08/2008 Document Revised: 12/04/2017 Document Reviewed: 12/04/2017 Elsevier Interactive Patient Education  2019 [employer].

## 2019-01-22 NOTE — Consult Note (Addendum)
[facility] Consult  Reason for Consult: Right breast abscess  Referring Physician:  Dr. Adriana Simas   Chief Complaint    Abscess      Virginia Mills is a 23 y.o. female.  HPI: Ms. Bassi is a 23 yo who is [redacted] weeks pregnant and has failed outpatient management of her right breast cellulitis and abscess. She has been seen at [facility] and treated with antibiotics over the course of this week, and it has been getting worse. The pain and swelling s tarted about 5 days ago and she was started on antibiotics. She followed up today with the worsening symptoms. The pain is constant and instance stabbing pain. She has comfort only with IV pain medications. She has not had fevers or chills. She has never had any breast abscess before or other abscess. She has no family history of breast cancer and no personal history of breast masses. She is otherwise healthy and has had a regular pregnancy per report. She has had a cough but was reportedly negative for the flu and coronavirus based on the documentation from [facility].    Past Medical History:  Diagnosis Date  . Bell's palsy   . HSV-2 infection     Past Surgical History:  Procedure Laterality Date  . INDUCED ABORTION    . INDUCED ABORTION      Family History  Problem Relation Age of Onset  . Diabetes Father   . Diabetes Paternal Grandmother   . Hypertension Paternal Grandmother   . Diabetes Paternal Grandfather   . Hypertension Paternal Grandfather   . Heart attack Paternal Grandfather   . Cancer Maternal Aunt   . Autism Cousin     Social History   Tobacco Use  . Smoking status: Former Smoker    Packs/day: 1.00    Types: Cigarettes  . Smokeless tobacco: Never Used  Substance Use Topics  . Alcohol use: Not Currently    Comment: on weekends  . Drug use: No    Types: Marijuana    Comment: no longer uses    Medications: I have reviewed the patient's current medications. Allergies as of 01/22/2019   No Known  Allergies     Medication List    TAKE these medications   amoxicillin-clavulanate 875-125 MG tablet Commonly known as:  AUGMENTIN Take 1 tablet by mouth 2 (two) times daily. X 10 days   benzonatate 200 MG capsule Commonly known as:  TESSALON Take 1 capsule (200 mg total) by mouth 3 (three) times daily as needed for cough.   Prenatal Vitamins 28-0.8 MG Tabs Take 1 tablet by mouth daily.   traMADol 50 MG tablet Commonly known as:  ULTRAM Take 1 tablet (50 mg total) by mouth every 6 (six) hours as needed for severe pain. What changed:  reasons to take this   TYLENOL PM EXTRA STRENGTH PO Take by mouth at bedtime.        ROS:  A comprehensive review of systems was negative except for: Integument/breast: positive for breast tenderness and swelling, abscess  Blood pressure 117/69, pulse 92, temperature 98 F (36.7 C), temperature source Oral, resp. rate 18, weight 65.5 kg, last menstrual period 06/20/2018, SpO2 100 %. Physical Exam Vitals signs reviewed.  Constitutional:      Appearance: Normal appearance.  HENT:     Head: Normocephalic.     Nose: Nose normal.     Mouth/Throat:     Mouth: Mucous membranes are moist.  Eyes:  Extraocular Movements: Extraocular movements intact.     Pupils: Pupils are equal, round, and reactive to light.  Neck:     Musculoskeletal: Normal range of motion.  Cardiovascular:     Rate and Rhythm: Normal rate.  Pulmonary:     Effort: Pulmonary effort is normal.  Chest:     Breasts:        Right: Swelling, skin change and tenderness present.      Comments: Right breast inferior areola with swelling, some mild redness inferior on the breast, tender, fluctuant, US used to assess and 2X2.5X4cm pocket seen with no obvious loculations  Abdominal:     Palpations: Abdomen is soft.     Comments: Gravid abdomen  Musculoskeletal: Normal range of motion.        General: No swelling.  Skin:    General: Skin is warm and dry.  Neurological:      General: No focal deficit present.     Mental Status: She is alert and oriented to person, place, and time.  Psychiatric:        Mood and Affect: Mood normal.        Behavior: Behavior normal.        Thought Content: Thought content normal.        Judgment: Judgment normal.     Results: Results for orders placed or performed during the hospital encounter of 01/22/19 (from the past 48 hour(s))  CBC with Differential     Status: Abnormal   Collection Time: 01/22/19  3:47 PM  Result Value Ref Range   WBC 12.3 (H) 4.0 - 10.5 K/uL   RBC 3.67 (L) 3.87 - 5.11 MIL/uL   Hemoglobin 10.2 (L) 12.0 - 15.0 g/dL   HCT 16.131.6 (L) 09.636.0 - 04.546.0 %   MCV 86.1 80.0 - 100.0 fL   MCH 27.8 26.0 - 34.0 pg   MCHC 32.3 30.0 - 36.0 g/dL   RDW 40.913.8 81.111.5 - 91.415.5 %   Platelets 213 150 - 400 K/uL   nRBC 0.0 0.0 - 0.2 %   Neutrophils Relative % 76 %   Neutro Abs 9.4 (H) 1.7 - 7.7 K/uL   Lymphocytes Relative 13 %   Lymphs Abs 1.6 0.7 - 4.0 K/uL   Monocytes Relative 8 %   Monocytes Absolute 1.0 0.1 - 1.0 K/uL   Eosinophils Relative 1 %   Eosinophils Absolute 0.1 0.0 - 0.5 K/uL   Basophils Relative 0 %   Basophils Absolute 0.0 0.0 - 0.1 K/uL   Immature Granulocytes 2 %   Abs Immature Granulocytes 0.19 (H) 0.00 - 0.07 K/uL    Comment: Performed at [facility], [address] [place], [place] [address]  Basic metabolic panel     Status: Abnormal   Collection Time: 01/22/19  3:47 PM  Result Value Ref Range   Sodium 132 (L) 135 - 145 mmol/L   Potassium 3.7 3.5 - 5.1 mmol/L   Chloride 102 98 - 111 mmol/L   CO2 21 (L) 22 - 32 mmol/L   Glucose, Bld 71 70 - 99 mg/dL   BUN 9 6 - 20 mg/dL   Creatinine, Ser 6.210.52 0.44 - 1.00 mg/dL   Calcium 8.4 (L) 8.9 - 10.3 mg/dL   GFR calc non Af Amer >60 >60 mL/min   GFR calc Af Amer >60 >60 mL/min   Anion gap 9 5 - 15    Comment: Performed at [facility], [address] [place], [place] [address]    Procedure: Incision  and Drainage of deep breast retro-areolar abscess  (50cc + purulence evacuated)  Pre-Procedure Diagnosis: Right breast abscess  Post Procedure Diagnosis: Same   Indications: Ms. Glennon is a 23 yo with a large abscess measuring about 2X2.5X4 cm on bedside US that is in the inferior portion of the areola of the right breast. She has some mild cellulitis associated with this abscess. We discussed the risk and benefits of drainage including but not limited to bleeding, infection, risk of needing further drainage, and she opted to proceed.   Procedure: A timeout was performed. The right breast was prepped and draped in the sterile fashion. Sterile gloves were used. 1% lidocaine was used to anesthetize the skin inferior on the circumareolar skin border. She tolerated this well.  A 2-3cm incision was made in the circumareolar fashion around the border. Approximately 50cc of purulence was evacuated. Cultures were obtained.  No loculations were found. A penrose drain was placed and sutured to the skin with 3-0 prolene suture.  This was placed to keep the cavity open and to prevent her from having to pack the cavity which is very large. There was adequate hemostasis. Gauze and papertape was placed. She tolerated the procedure well. She received another 4mg  of morphine for pain control following the I&D.    Assessment & Plan:  TEANA KOCA is a 23 y.o. female with a right breast abscess s/p incision and drainage at the bedside. She is doing well now. Pain is more controlled.  Keep drain in place. Shower as per your regular routine.  Change your dressing as needed. Expect more bloody, purulent drain in the next few days and this will taper.  You can use pads instead of gauze to collection the drainage (this is cheaper option).  Wear a loose fitting bra or cover.  Take your augmentin as prescribed. Take tylenol as needed for pain and the tramadol for severe or breakthrough pain.   Will see Tuesday 01/26/2019 @ 11Am to assess and see if the drain can be  removed.   All questions were answered to the satisfaction of the patient and family.   Lucretia Roers 01/22/2019, 5:21 PM

## 2019-01-22 NOTE — ED Triage Notes (Addendum)
Pt sent over by [facility] for Dr. Lovell Sheehan to drain a RT breast abscess due to a clogged milk duct. Pt has been on abx since 3/18, but abscess has worsened. [facility] RN reported that mom and baby were evaluated in office by OB prior to ED arrival. No need for fetal monitoring per OB office. Pt is [redacted] weeks pregnant.

## 2019-01-22 NOTE — Telephone Encounter (Signed)
Patient called, stated that she was sent to the ER and they have no idea why she's there.  [phone]

## 2019-01-22 NOTE — ED Notes (Signed)
Dr Henreitta Leber in to eval and I&D

## 2019-01-22 NOTE — Progress Notes (Signed)
   GYN VISIT Patient name: Virginia Mills MRN [id number]  Date of birth: 1996-09-10 Chief Complaint:   clogged milk duct  History of Present Illness:   Virginia Mills is a 23 y.o. [phone] African American female [redacted]w[redacted]d being seen today for f/u on Rt breast abscess. Seen 3/18, co-exam w/ Dr. Alysia Penna, abscess 4x4 at that time, started on augmentin BID. Rx'd ultram for pain- not helping. She reports it has gotten much worse. Tearful. Just wants it drained. Good fm, no lof/uc's/vb.  Patient's last menstrual period was 06/20/2018 (exact date). Review of Systems:   Pertinent items are noted in HPI Denies fever/chills, dizziness, headaches, visual disturbances, fatigue, shortness of breath, chest pain, abdominal pain, vomiting, abnormal vaginal discharge/itching/odor/irritation, problems with periods, bowel movements, urination, or intercourse unless otherwise stated above.  Pertinent History Reviewed:  Reviewed past medical,surgical, social, obstetrical and family history.  Reviewed problem list, medications and allergies. Physical Assessment:   Vitals:   01/22/19 1254  BP: 113/73  Pulse: 77  Temp: 98.5 F (36.9 C)  Weight: 144 lb 8 oz (65.5 kg)  Body mass index is 24.05 kg/m.       Physical Examination:   General appearance: alert, well appearing, and in no distress  Mental status: alert, oriented to person, place, and time  Skin: warm & dry   Cardiovascular: normal heart rate noted  Respiratory: normal respiratory effort, no distress  Breast: Rt breast abscess much larger, erythema/induration takes up entire areola/lower breast, cracking skin on areola. Co-exam w/ Dr. Despina Hidden- send to ED, he will call Dr. Lovell Sheehan  Abdomen: soft, non-tender   Pelvic: examination not indicated  Extremities: no edema   Results for orders placed or performed in visit on 01/22/19 (from the past 24 hour(s))  POC Urinalysis Dipstick OB   Collection Time: 01/22/19 12:55 PM  Result Value Ref Range   Color, UA     Clarity, UA     Glucose, UA Negative Negative   Bilirubin, UA     Ketones, UA neg    Spec Grav, UA     Blood, UA neg    pH, UA     POC,PROTEIN,UA Negative Negative, Trace, Small (1+), Moderate (2+), Large (3+), 4+   Urobilinogen, UA     Nitrite, UA neg    Leukocytes, UA Negative Negative   Appearance     Odor      Assessment & Plan:  1) [redacted]w[redacted]d pregnant  2) Rt breast abscess> worsening, notified [facility] charge RN of pt. Dr.Eure calling Dr.Jenkins. Pt to go straight to ED now  Meds: No orders of the defined types were placed in this encounter.   Orders Placed This Encounter  Procedures  . POC Urinalysis Dipstick OB    Return for f/u Monday w/ [facility] for f/u.  Cheral Marker CNM, [facility] 01/22/2019 1:12 PM

## 2019-01-22 NOTE — ED Provider Notes (Signed)
[facility] EMERGENCY DEPARTMENT Provider Note   CSN: [id number] Arrival date & time: 01/22/19  1315    History   Chief Complaint Chief Complaint  Patient presents with  . Abscess    HPI Virginia Mills is a 23 y.o. female.     G2, P1 approximately [redacted] weeks pregnant [place] with an abscess in her right breast due to a "clogged milk duct".  She has been on Augmentin 875/125 since 01/20/2019 but symptoms have worsened.  No concerns about pregnancy.  No vaginal bleeding or discharge.  Good fetal movement.  Severity is moderate to severe.  Palpation makes pain worse.     Past Medical History:  Diagnosis Date  . Bell's palsy   . HSV-2 infection     Patient Active Problem List   Diagnosis Date Noted  . Breast abscess 01/20/2019  . Asymptomatic bacteriuria during pregnancy 09/17/2018  . Supervision of normal pregnancy 08/27/2018  . History of prior pregnancy with IUGR newborn 08/27/2018  . Depression 07/30/2018  . 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 Marijuana use 04/24/2013    Past Surgical History:  Procedure Laterality Date  . INDUCED ABORTION    . INDUCED ABORTION       OB History    Gravida  4   Para  1   Term  1   Preterm      AB  2   Living  1     SAB      TAB  2   Ectopic      Multiple      Live Births  1            Home Medications    Prior to Admission medications   Medication Sig Start Date End Date Taking? Authorizing Provider  amoxicillin-clavulanate (AUGMENTIN) 875-125 MG tablet Take 1 tablet by mouth 2 (two) times daily. X 10 days 01/20/19  Yes Cheral Marker, CNM  benzonatate (TESSALON) 200 MG capsule Take 1 capsule (200 mg total) by mouth 3 (three) times daily as needed for cough. 01/15/19  Yes Cresenzo-Dishmon, Scarlette Calico, CNM  diphenhydrAMINE-APAP, sleep, (TYLENOL PM EXTRA STRENGTH PO) Take by mouth at bedtime.   Yes [provider]  Prenatal Vit-Fe Fumarate-FA (PRENATAL VITAMINS) 28-0.8 MG TABS Take 1 tablet by mouth  daily. 07/30/18  Yes Adline Potter, NP  traMADol (ULTRAM) 50 MG tablet Take 1 tablet (50 mg total) by mouth every 6 (six) hours as needed. 01/20/19  Yes Cheral Marker, CNM    Family History Family History  Problem Relation Age of Onset  . Diabetes Father   . Diabetes Paternal Grandmother   . Hypertension Paternal Grandmother   . Diabetes Paternal Grandfather   . Hypertension Paternal Grandfather   . Heart attack Paternal Grandfather   . Cancer Maternal Aunt   . Autism Cousin     Social History Social History   Tobacco Use  . Smoking status: Former Smoker    Packs/day: 1.00    Types: Cigarettes  . Smokeless tobacco: Never Used  Substance Use Topics  . Alcohol use: Not Currently    Comment: on weekends  . Drug use: No    Types: Marijuana    Comment: no longer uses     Allergies   Patient has no known allergies.   Review of Systems Review of Systems  All other systems reviewed and are negative.    Physical Exam Updated Vital Signs BP 117/69 (BP Location: Right Arm)   Pulse 92  Temp 98 F (36.7 C) (Oral)   Resp 18   Wt 65.5 kg   LMP 06/20/2018 (Exact Date)   SpO2 100%   BMI 24.05 kg/m   Physical Exam Vitals signs and nursing note reviewed.  Constitutional:      Appearance: She is well-developed.     Comments: gravid  HENT:     Head: Normocephalic and atraumatic.  Eyes:     Conjunctiva/sclera: Conjunctivae normal.  Neck:     Musculoskeletal: Neck supple.  Cardiovascular:     Rate and Rhythm: Normal rate and regular rhythm.  Pulmonary:     Effort: Pulmonary effort is normal.     Breath sounds: Normal breath sounds.  Abdominal:     General: Bowel sounds are normal.     Palpations: Abdomen is soft.  Musculoskeletal: Normal range of motion.  Skin:    Comments: Right breast: Obvious abscess/cellulitis in the areola.  Neurological:     Mental Status: She is alert and oriented to person, place, and time.  Psychiatric:        Behavior:  Behavior normal.      ED Treatments / Results  Labs (all labs ordered are listed, but only abnormal results are displayed) Labs Reviewed  CBC WITH DIFFERENTIAL/PLATELET  BASIC METABOLIC PANEL    EKG None  Radiology No results found.  Procedures Procedures (including critical care time)  Medications Ordered in ED Medications  sodium chloride 0.9 % bolus 500 mL (500 mLs Intravenous New Bag/Given 01/22/19 1553)  morphine 4 MG/ML injection 4 mg (4 mg Intravenous Given 01/22/19 1554)     Initial Impression / Assessment and Plan / ED Course  I have reviewed the triage vital signs and the nursing notes.  Pertinent labs & imaging results that were available during my care of the patient were reviewed by me and considered in my medical decision making (see chart for details).        Will consult general surgery for right breast aerola abscess  Final Clinical Impressions(s) / ED Diagnoses   Final diagnoses:  Abscess of right breast    ED Discharge Orders    None       Donnetta Hutching, MD 01/22/19 1557

## 2019-01-25 ENCOUNTER — Encounter: Payer: Medicaid Other | Admitting: Obstetrics & Gynecology

## 2019-01-26 ENCOUNTER — Ambulatory Visit: Payer: Self-pay | Admitting: General Surgery

## 2019-01-27 LAB — AEROBIC/ANAEROBIC CULTURE W GRAM STAIN (SURGICAL/DEEP WOUND)

## 2019-01-28 ENCOUNTER — Ambulatory Visit: Payer: Self-pay | Admitting: General Surgery

## 2019-01-28 ENCOUNTER — Telehealth: Payer: Self-pay | Admitting: *Deleted

## 2019-01-28 ENCOUNTER — Telehealth: Payer: Self-pay | Admitting: Emergency Medicine

## 2019-01-28 NOTE — Telephone Encounter (Signed)
Called patient twice and tried to call patients mother rhonda to confirm appointment for today , no response left voice mails and notified doctor bridges.

## 2019-01-28 NOTE — Telephone Encounter (Signed)
Post ED Visit - Positive Culture Follow-up  Culture report reviewed by antimicrobial stewardship pharmacist: [facility] []  Enzo Bi, Pharm.D. []  Celedonio Miyamoto, [address].D., BCPS AQ-ID []  Garvin Fila, Pharm.D., BCPS []  Georgina Pillion, Pharm.D., BCPS []  [place], [address].D., BCPS, AAHIVP []  Estella Husk, Pharm.D., BCPS, AAHIVP []  Lysle Pearl, PharmD, BCPS []  Phillips Climes, PharmD, BCPS [x]  Agapito Games, PharmD, BCPS []  Verlan Friends, PharmD []  Mervyn Gay, PharmD, BCPS []  Vinnie Level, PharmD  [facility] []  Len Childs, PharmD []  Greer Pickerel, PharmD []  Adalberto Cole, PharmD []  Perlie Gold, Rph []  Lonell Face) Jean Rosenthal, PharmD []  Earl Many, PharmD []  Junita Push, PharmD []  Dorna Leitz, PharmD []  Terrilee Files, PharmD []  Lynann Beaver, PharmD []  Keturah Barre, PharmD []  Loralee Pacas, PharmD []  Bernadene Person, PharmD   Positive wound culture Treated with Augmentin, organism sensitive to the same and no further patient follow-up is required at this time.  Virl Axe [facility] 01/28/2019, 8:02 AM

## 2019-01-29 ENCOUNTER — Encounter: Payer: Medicaid Other | Admitting: Obstetrics & Gynecology

## 2019-02-01 ENCOUNTER — Ambulatory Visit (INDEPENDENT_AMBULATORY_CARE_PROVIDER_SITE_OTHER): Payer: Medicaid Other

## 2019-02-01 ENCOUNTER — Other Ambulatory Visit: Payer: Self-pay

## 2019-02-01 ENCOUNTER — Ambulatory Visit (INDEPENDENT_AMBULATORY_CARE_PROVIDER_SITE_OTHER): Payer: Medicaid Other | Admitting: Obstetrics & Gynecology

## 2019-02-01 VITALS — BP 116/74 | HR 82 | Wt 148.0 lb

## 2019-02-01 DIAGNOSIS — Z362 Encounter for other antenatal screening follow-up: Secondary | ICD-10-CM | POA: Diagnosis not present

## 2019-02-01 DIAGNOSIS — Z3403 Encounter for supervision of normal first pregnancy, third trimester: Secondary | ICD-10-CM

## 2019-02-01 DIAGNOSIS — Z3A33 33 weeks gestation of pregnancy: Secondary | ICD-10-CM

## 2019-02-01 DIAGNOSIS — N611 Abscess of the breast and nipple: Secondary | ICD-10-CM

## 2019-02-01 DIAGNOSIS — Z8759 Personal history of other complications of pregnancy, childbirth and the puerperium: Secondary | ICD-10-CM | POA: Diagnosis not present

## 2019-02-01 MED ORDER — ACYCLOVIR 400 MG PO TABS: 400.0000 mg | ORAL_TABLET | Freq: Three times a day (TID) | ORAL | 2 refills | Status: DC

## 2019-02-01 NOTE — Progress Notes (Signed)
[place] 33+1 wks,cephalic,cx 2.9 cm,anterior placenta gr 2,afi 15 cm,fhr 166 bpm,normal ovaries bilat,efw 2302 g 65%

## 2019-02-01 NOTE — Progress Notes (Signed)
   LOW-RISK PREGNANCY VISIT Patient name: Virginia Mills MRN [id number]  Date of birth: [birth year] Chief Complaint:   Routine Prenatal Visit  History of Present Illness:   Virginia Mills is a 23 y.o. [id number] female at [redacted]w[redacted]d with an Estimated Date of Delivery: 03/21/19 being seen today for ongoing management of a low-risk pregnancy.  Today she reports no complaints. Contractions: Not present. Vag. Bleeding: None.  Movement: Present. denies leaking of fluid. Review of Systems:   Pertinent items are noted in HPI Denies abnormal vaginal discharge w/ itching/odor/irritation, headaches, visual changes, shortness of breath, chest pain, abdominal pain, severe nausea/vomiting, or problems with urination or bowel movements unless otherwise stated above. Pertinent History Reviewed:  Reviewed past medical,surgical, social, obstetrical and family history.  Reviewed problem list, medications and allergies. Physical Assessment:   Vitals:   02/01/19 1534  BP: 116/74  Pulse: 82  Weight: 148 lb (67.1 kg)  Body mass index is 24.63 kg/m.        Physical Examination:   General appearance: Well appearing, and in no distress  Mental status: Alert, oriented to person, place, and time  Skin: Warm & dry  Cardiovascular: Normal heart rate noted  Respiratory: Normal respiratory effort, no distress  Abdomen: Soft, gravid, nontender  Pelvic: Cervical exam deferred         Extremities: Edema: None  Fetal Status:     Movement: Present    No results found for this or any previous visit (from the past 24 hour(s)).  Assessment & Plan:  1) Low-risk pregnancy G4P1021 at [redacted]w[redacted]d with an Estimated Date of Delivery: 03/21/19   2) Breast abscess, has drain in still to be taken out by Dr Henreitta Leber   Meds:  Meds ordered this encounter  Medications  . acyclovir (ZOVIRAX) 400 MG tablet    Sig: Take 1 tablet (400 mg total) by mouth 3 (three) times daily.    Dispense:  90 tablet    Refill:  2   Labs/procedures  today: excellent growth normal sonogram  Plan:  Continue routine obstetrical care   Reviewed: Preterm labor symptoms and general obstetric precautions including but not limited to vaginal bleeding, contractions, leaking of fluid and fetal movement were reviewed in detail with the patient.  All questions were answered  Follow-up: Return in about 3 weeks (around 02/22/2019) for LROB.  cultures  No orders of the defined types were placed in this encounter.  Lazaro Arms  02/01/2019 3:50 PM

## 2019-02-02 ENCOUNTER — Telehealth: Payer: Self-pay | Admitting: Emergency Medicine

## 2019-02-02 NOTE — Telephone Encounter (Signed)
Called patient to follow up with her because she has missed a few appointments  to have her drain removed. After speaking with the patient she stated the stitch fell out yesterday and  When she woke up this morning the drain had fallen out. She stated she is not having any problems and that everything looked good. She denied any fever, nausea or vomiting.  Virginia Mills stated she would call us if she needed [place]. I  notified the provider.

## 2019-02-19 ENCOUNTER — Telehealth: Payer: Self-pay | Admitting: *Deleted

## 2019-02-19 NOTE — Telephone Encounter (Signed)
Patient informed that we are not allowing visitors or children to come to appointments at this time. Patient denies any contact with anyone suspected or confirmed of having COVID-19. Pt denies fever, cough, sob, muscle pain, diarrhea, rash, vomiting, abdominal pain, red eye, weakness, bruising or bleeding, joint pain or severe headache.  

## 2019-02-22 ENCOUNTER — Encounter: Payer: Self-pay | Admitting: Advanced Practice Midwife

## 2019-02-22 ENCOUNTER — Ambulatory Visit (INDEPENDENT_AMBULATORY_CARE_PROVIDER_SITE_OTHER): Payer: Medicaid Other | Admitting: Advanced Practice Midwife

## 2019-02-22 ENCOUNTER — Other Ambulatory Visit: Payer: Self-pay

## 2019-02-22 VITALS — BP 103/65 | HR 83 | Temp 98.7°F | Wt 155.3 lb

## 2019-02-22 DIAGNOSIS — F1290 Cannabis use, unspecified, uncomplicated: Secondary | ICD-10-CM

## 2019-02-22 DIAGNOSIS — Z3483 Encounter for supervision of other normal pregnancy, third trimester: Secondary | ICD-10-CM

## 2019-02-22 DIAGNOSIS — Z1389 Encounter for screening for other disorder: Secondary | ICD-10-CM

## 2019-02-22 DIAGNOSIS — Z331 Pregnant state, incidental: Secondary | ICD-10-CM

## 2019-02-22 DIAGNOSIS — Z3A36 36 weeks gestation of pregnancy: Secondary | ICD-10-CM

## 2019-02-22 DIAGNOSIS — R8290 Unspecified abnormal findings in urine: Secondary | ICD-10-CM

## 2019-02-22 LAB — POCT URINALYSIS DIPSTICK OB
Blood, UA: NEGATIVE
Glucose, UA: NEGATIVE
Ketones, UA: NEGATIVE
Leukocytes, UA: NEGATIVE
Nitrite, UA: NEGATIVE

## 2019-02-22 MED ORDER — BLOOD PRESSURE MONITOR AUTOMAT DEVI: 0 refills | Status: DC

## 2019-02-22 NOTE — Progress Notes (Signed)
  [id number] [redacted]w[redacted]d Estimated Date of Delivery: 03/21/19  Blood pressure 103/65, pulse 83, temperature 98.7 F (37.1 C), weight 155 lb 4.8 oz (70.4 kg), last menstrual period 06/20/2018.   BP weight and urine results all reviewed and noted.Urine has strong ammonia odor, no UTI sx.  Please refer to the obstetrical flow sheet for the fundal height and fetal heart rate documentation:  Patient reports good fetal movement, denies any bleeding and no rupture of membranes symptoms or regular contractions. Patient is without complaints.All questions were answered.   Physical Assessment:   Vitals:   02/22/19 1413  BP: 103/65  Pulse: 83  Temp: 98.7 F (37.1 C)  Weight: 155 lb 4.8 oz (70.4 kg)  Body mass index is 25.84 kg/m.        Physical Examination:   General appearance: Well appearing, and in no distress  Mental status: Alert, oriented to person, place, and time  Skin: Warm & dry  Cardiovascular: Normal heart rate noted  Respiratory: Normal respiratory effort, no distress  Abdomen: Soft, gravid, nontender  Pelvic: Cervical exam deferred  Dilation: 2.5 Effacement (%): 50 Station: -2  Extremities: Edema: None  Fetal Status: Fetal Heart Rate (bpm): 150 Fundal Height: 35 cm Movement: Present Presentation: Vertex  Results for orders placed or performed in visit on 02/22/19 (from the past 24 hour(s))  POC Urinalysis Dipstick OB   Collection Time: 02/22/19  2:19 PM  Result Value Ref Range   Color, UA     Clarity, UA     Glucose, UA Negative Negative   Bilirubin, UA     Ketones, UA neg    Spec Grav, UA     Blood, UA neg    pH, UA     POC,PROTEIN,UA Trace Negative, Trace, Small (1+), Moderate (2+), Large (3+), 4+   Urobilinogen, UA     Nitrite, UA neg    Leukocytes, UA Negative Negative   Appearance     Odor       Orders Placed This Encounter  Procedures  . Strep Gp B NAA  . GC/Chlamydia Probe Amp  . Urine Culture  . Pain Management Screening Profile (10S)  . Urinalysis  .  POC Urinalysis Dipstick OB    Plan:  Continued routine obstetrical care, Web ex app downloaded and pt shown how to use it Rx for BP cuff given.  Return in about 1 week (around 03/01/2019) for web ex LROB.

## 2019-02-22 NOTE — Patient Instructions (Addendum)

## 2019-02-23 LAB — PMP SCREEN PROFILE (10S), URINE
Amphetamine Scrn, Ur: NEGATIVE ng/mL
BARBITURATE SCREEN URINE: NEGATIVE ng/mL
BENZODIAZEPINE SCREEN, URINE: NEGATIVE ng/mL
CANNABINOIDS UR QL SCN: NEGATIVE ng/mL
Cocaine (Metab) Scrn, Ur: NEGATIVE ng/mL
Creatinine(Crt), U: 44 mg/dL (ref 20.0–300.0)
Methadone Screen, Urine: NEGATIVE ng/mL
OXYCODONE+OXYMORPHONE UR QL SCN: NEGATIVE ng/mL
Opiate Scrn, Ur: NEGATIVE ng/mL
Ph of Urine: 8.8 (ref 4.5–8.9)
Phencyclidine Qn, Ur: NEGATIVE ng/mL
Propoxyphene Scrn, Ur: NEGATIVE ng/mL

## 2019-02-23 LAB — URINALYSIS
Bilirubin, UA: NEGATIVE
Ketones, UA: NEGATIVE
Nitrite, UA: NEGATIVE
RBC, UA: NEGATIVE
Specific Gravity, UA: 1.011 (ref 1.005–1.030)
Urobilinogen, Ur: 0.2 mg/dL (ref 0.2–1.0)
pH, UA: >=9 — AB (ref 5.0–7.5)

## 2019-02-24 LAB — STREP GP B NAA: Strep Gp B NAA: NEGATIVE

## 2019-02-24 LAB — URINE CULTURE: Organism ID, Bacteria: NO GROWTH

## 2019-02-24 LAB — GC/CHLAMYDIA PROBE AMP
Chlamydia trachomatis, NAA: NEGATIVE
Neisseria Gonorrhoeae by PCR: NEGATIVE

## 2019-03-01 ENCOUNTER — Other Ambulatory Visit: Payer: Self-pay

## 2019-03-01 ENCOUNTER — Ambulatory Visit (INDEPENDENT_AMBULATORY_CARE_PROVIDER_SITE_OTHER): Payer: Medicaid Other | Admitting: Obstetrics and Gynecology

## 2019-03-01 ENCOUNTER — Encounter: Payer: Self-pay | Admitting: Obstetrics and Gynecology

## 2019-03-01 VITALS — BP 129/83 | HR 76

## 2019-03-01 DIAGNOSIS — R768 Other specified abnormal immunological findings in serum: Secondary | ICD-10-CM

## 2019-03-01 DIAGNOSIS — Z3483 Encounter for supervision of other normal pregnancy, third trimester: Secondary | ICD-10-CM

## 2019-03-01 DIAGNOSIS — Z3A37 37 weeks gestation of pregnancy: Secondary | ICD-10-CM

## 2019-03-01 NOTE — Progress Notes (Signed)
   PRENATAL VISIT NOTE TELEHEALTH VIRTUAL OBSTETRICS VISIT ENCOUNTER NOTE  I connected with@ on 03/01/19 at  2:30 PM EDT by Webex at home and verified that I am speaking with the correct person using two identifiers.   I discussed the limitations, risks, security and privacy concerns of performing an evaluation and management service by telephone and the availability of in person appointments. I also discussed with the patient that there may be a patient responsible charge related to this service. The patient expressed understanding and agreed to proceed. Subjective:  Virginia Mills is a 23 y.o. [phone] at [redacted]w[redacted]d being seen today for ongoing prenatal care.  She is currently monitored for the following issues for this low-risk pregnancy and has Marijuana use; HSV-2 seropositive; Depression; Supervision of normal pregnancy; History of prior pregnancy with IUGR newborn; and Asymptomatic bacteriuria during pregnancy on their problem list.  Patient reports general discomforts of pregnancy.  Reports fetal movement. Contractions: Irregular. Vag. Bleeding: None.  Movement: Present. Denies any contractions, bleeding or leaking of fluid.   The following portions of the patient's history were reviewed and updated as appropriate: allergies, current medications, past family history, past medical history, past social history, past surgical history and problem list.   Objective:   Vitals:   03/01/19 1511  BP: 129/83  Pulse: 76    Fetal Status:     Movement: Present     General:  Alert, oriented and cooperative. Patient is in no acute distress.  Respiratory: Normal respiratory effort, no problems with respiration noted  Mental Status: Normal mood and affect. Normal behavior. Normal judgment and thought content.  Rest of physical exam deferred due to type of encounter  Assessment and Plan:  Pregnancy: G4P1021 at [redacted]w[redacted]d 1. Encounter for supervision of other normal pregnancy in third trimester Stable  Labor precautions reviewed  2. HSV-2 seropositive Continue with suppression  Term labor symptoms and general obstetric precautions including but not limited to vaginal bleeding, contractions, leaking of fluid and fetal movement were reviewed in detail with the patient. I discussed the assessment and treatment plan with the patient. The patient was provided an opportunity to ask questions and all were answered. The patient agreed with the plan and demonstrated an understanding of the instructions. The patient was advised to call back or seek an in-person office evaluation/go to MAU at [facility] for any urgent or concerning symptoms. Please refer to After Visit Summary for other counseling recommendations.   I provided 11 minutes of non-face-to-face time during this encounter. Return in about 2 weeks (around 03/15/2019) for OB visit, face to face.  No future appointments.  Hermina Staggers, MD Center for [facility], [facility]

## 2019-03-03 ENCOUNTER — Telehealth: Payer: Self-pay | Admitting: *Deleted

## 2019-03-03 NOTE — Telephone Encounter (Signed)
Patient states she is not feeling well as her back and hips are hurting and she is having nausea and vomiting.  States she has not taken anything for the nausea. Advised to taken medication as it may help with her nausea.  Also encouraged to use pillows under her belly and between knees for comfort. She also states she has not felt the baby move as much either.  Advised to try eating and/or drinking something sweet and try playing loud music on her phone and play next to her abdomen to see if that will wake the baby. If she does not get movements in the next hour, to call our office and come in for NST.  Pt verbalized understanding.

## 2019-03-11 ENCOUNTER — Encounter: Payer: Self-pay | Admitting: *Deleted

## 2019-03-15 ENCOUNTER — Inpatient Hospital Stay (HOSPITAL_COMMUNITY)
Admission: AD | Admit: 2019-03-15 | Discharge: 2019-03-17 | DRG: 806 | Disposition: A | Discharge status: Home / Self Care | Payer: Medicaid Other | Attending: Obstetrics and Gynecology | Admitting: Obstetrics and Gynecology

## 2019-03-15 ENCOUNTER — Other Ambulatory Visit: Payer: Self-pay

## 2019-03-15 ENCOUNTER — Inpatient Hospital Stay (HOSPITAL_COMMUNITY): Payer: Medicaid Other | Admitting: Anesthesiology

## 2019-03-15 ENCOUNTER — Encounter: Payer: Medicaid Other | Admitting: Obstetrics & Gynecology

## 2019-03-15 ENCOUNTER — Encounter (HOSPITAL_COMMUNITY): Payer: Self-pay | Admitting: *Deleted

## 2019-03-15 DIAGNOSIS — O99324 Drug use complicating childbirth: Secondary | ICD-10-CM | POA: Diagnosis present

## 2019-03-15 DIAGNOSIS — O26893 Other specified pregnancy related conditions, third trimester: Secondary | ICD-10-CM | POA: Diagnosis present

## 2019-03-15 DIAGNOSIS — O139 Gestational [pregnancy-induced] hypertension without significant proteinuria, unspecified trimester: Secondary | ICD-10-CM

## 2019-03-15 DIAGNOSIS — A6000 Herpesviral infection of urogenital system, unspecified: Secondary | ICD-10-CM | POA: Diagnosis present

## 2019-03-15 DIAGNOSIS — O1414 Severe pre-eclampsia complicating childbirth: Principal | ICD-10-CM | POA: Diagnosis present

## 2019-03-15 DIAGNOSIS — R768 Other specified abnormal immunological findings in serum: Secondary | ICD-10-CM | POA: Diagnosis present

## 2019-03-15 DIAGNOSIS — Z3483 Encounter for supervision of other normal pregnancy, third trimester: Secondary | ICD-10-CM

## 2019-03-15 DIAGNOSIS — O9832 Other infections with a predominantly sexual mode of transmission complicating childbirth: Secondary | ICD-10-CM | POA: Diagnosis present

## 2019-03-15 DIAGNOSIS — Z8759 Personal history of other complications of pregnancy, childbirth and the puerperium: Secondary | ICD-10-CM

## 2019-03-15 DIAGNOSIS — Z3A39 39 weeks gestation of pregnancy: Secondary | ICD-10-CM

## 2019-03-15 DIAGNOSIS — F1290 Cannabis use, unspecified, uncomplicated: Secondary | ICD-10-CM | POA: Diagnosis present

## 2019-03-15 DIAGNOSIS — F32A Depression, unspecified: Secondary | ICD-10-CM | POA: Diagnosis present

## 2019-03-15 DIAGNOSIS — Z87891 Personal history of nicotine dependence: Secondary | ICD-10-CM

## 2019-03-15 DIAGNOSIS — F329 Major depressive disorder, single episode, unspecified: Secondary | ICD-10-CM | POA: Diagnosis present

## 2019-03-15 DIAGNOSIS — O1410 Severe pre-eclampsia, unspecified trimester: Secondary | ICD-10-CM | POA: Diagnosis present

## 2019-03-15 LAB — CBC
HCT: 32.2 % — ABNORMAL LOW (ref 36.0–46.0)
HCT: 32 % — ABNORMAL LOW (ref 36.0–46.0)
Hemoglobin: 10.3 g/dL — ABNORMAL LOW (ref 12.0–15.0)
Hemoglobin: 10.3 g/dL — ABNORMAL LOW (ref 12.0–15.0)
MCH: 27.2 pg (ref 26.0–34.0)
MCH: 27 pg (ref 26.0–34.0)
MCHC: 32 g/dL (ref 30.0–36.0)
MCHC: 32.2 g/dL (ref 30.0–36.0)
MCV: 85.2 fL (ref 80.0–100.0)
MCV: 83.8 fL (ref 80.0–100.0)
Platelets: 140 10*3/uL — ABNORMAL LOW (ref 150–400)
Platelets: 121 10*3/uL — ABNORMAL LOW (ref 150–400)
RBC: 3.78 MIL/uL — ABNORMAL LOW (ref 3.87–5.11)
RBC: 3.82 MIL/uL — ABNORMAL LOW (ref 3.87–5.11)
RDW: 14.5 % (ref 11.5–15.5)
RDW: 14.4 % (ref 11.5–15.5)
WBC: 8 10*3/uL (ref 4.0–10.5)
WBC: 11.4 10*3/uL — ABNORMAL HIGH (ref 4.0–10.5)
nRBC: 0 % (ref 0.0–0.2)
nRBC: 0 % (ref 0.0–0.2)

## 2019-03-15 LAB — RAPID URINE DRUG SCREEN, HOSP PERFORMED
Amphetamines: NOT DETECTED
Barbiturates: NOT DETECTED
Benzodiazepines: NOT DETECTED
Cocaine: NOT DETECTED
Opiates: NOT DETECTED
Tetrahydrocannabinol: POSITIVE — AB

## 2019-03-15 LAB — COMPREHENSIVE METABOLIC PANEL
ALT: 18 U/L (ref 0–44)
ALT: 19 U/L (ref 0–44)
AST: 27 U/L (ref 15–41)
AST: 29 U/L (ref 15–41)
Albumin: 2.6 g/dL — ABNORMAL LOW (ref 3.5–5.0)
Albumin: 2.4 g/dL — ABNORMAL LOW (ref 3.5–5.0)
Alkaline Phosphatase: 216 U/L — ABNORMAL HIGH (ref 38–126)
Alkaline Phosphatase: 196 U/L — ABNORMAL HIGH (ref 38–126)
Anion gap: 12 (ref 5–15)
Anion gap: 13 (ref 5–15)
BUN: <5 mg/dL — ABNORMAL LOW (ref 6–20)
BUN: <5 mg/dL — ABNORMAL LOW (ref 6–20)
CO2: 25 mmol/L (ref 22–32)
CO2: 21 mmol/L — ABNORMAL LOW (ref 22–32)
Calcium: 8.7 mg/dL — ABNORMAL LOW (ref 8.9–10.3)
Calcium: 8.2 mg/dL — ABNORMAL LOW (ref 8.9–10.3)
Chloride: 100 mmol/L (ref 98–111)
Chloride: 103 mmol/L (ref 98–111)
Creatinine, Ser: 0.72 mg/dL (ref 0.44–1.00)
Creatinine, Ser: 0.73 mg/dL (ref 0.44–1.00)
GFR calc Af Amer: >60 mL/min (ref >60)
GFR calc Af Amer: >60 mL/min (ref >60)
GFR calc non Af Amer: >60 mL/min (ref >60)
GFR calc non Af Amer: >60 mL/min (ref >60)
Glucose, Bld: 99 mg/dL (ref 70–99)
Glucose, Bld: 110 mg/dL — ABNORMAL HIGH (ref 70–99)
Potassium: 3.6 mmol/L (ref 3.5–5.1)
Potassium: 3.3 mmol/L — ABNORMAL LOW (ref 3.5–5.1)
Sodium: 137 mmol/L (ref 135–145)
Sodium: 137 mmol/L (ref 135–145)
Total Bilirubin: 0.3 mg/dL (ref 0.3–1.2)
Total Bilirubin: 0.3 mg/dL (ref 0.3–1.2)
Total Protein: 6.6 g/dL (ref 6.5–8.1)
Total Protein: 5.7 g/dL — ABNORMAL LOW (ref 6.5–8.1)

## 2019-03-15 LAB — RPR: RPR Ser Ql: NONREACTIVE

## 2019-03-15 LAB — POCT FERN TEST: POCT Fern Test: POSITIVE

## 2019-03-15 LAB — ABO/RH: ABO/RH(D): O POS

## 2019-03-15 LAB — TYPE AND SCREEN
ABO/RH(D): O POS
Antibody Screen: NEGATIVE

## 2019-03-15 LAB — PROTEIN / CREATININE RATIO, URINE
Creatinine, Urine: 143.63 mg/dL
Protein Creatinine Ratio: 0.15 mg/mg{Cre} (ref 0.00–0.15)
Total Protein, Urine: 22 mg/dL

## 2019-03-15 MED ORDER — COCONUT OIL OIL: 1.0000 "application " | TOPICAL_OIL | Status: DC | PRN

## 2019-03-15 MED ORDER — LIDOCAINE HCL (PF) 1 % IJ SOLN: 30.0000 mL | INTRAMUSCULAR | Status: DC | PRN

## 2019-03-15 MED ORDER — LABETALOL HCL 5 MG/ML IV SOLN: 40.0000 mg | INTRAVENOUS | Status: DC | PRN

## 2019-03-15 MED ORDER — SODIUM CHLORIDE (PF) 0.9 % IJ SOLN
INTRAMUSCULAR | Status: DC | PRN
  Administered 2019-03-15: 12 mL/h via EPIDURAL

## 2019-03-15 MED ORDER — OXYTOCIN 40 UNITS IN NORMAL SALINE INFUSION - SIMPLE MED
2.5000 [IU]/h | INTRAVENOUS | Status: DC
  Filled 2019-03-15 (×2): qty 1000

## 2019-03-15 MED ORDER — FENTANYL-BUPIVACAINE-NACL 0.5-0.125-0.9 MG/250ML-% EP SOLN
12.0000 mL/h | EPIDURAL | Status: DC | PRN
  Filled 2019-03-15: qty 250

## 2019-03-15 MED ORDER — HYDRALAZINE HCL 20 MG/ML IJ SOLN
5.0000 mg | INTRAMUSCULAR | Status: DC | PRN
  Administered 2019-03-15: 5 mg via INTRAVENOUS

## 2019-03-15 MED ORDER — LACTATED RINGERS IV SOLN: 500.0000 mL | INTRAVENOUS | Status: DC | PRN

## 2019-03-15 MED ORDER — MAGNESIUM SULFATE 40 G IN LACTATED RINGERS - SIMPLE
2.0000 g/h | INTRAVENOUS | Status: AC
  Administered 2019-03-16: 06:00:00 2 g/h via INTRAVENOUS
  Filled 2019-03-15: qty 500

## 2019-03-15 MED ORDER — LACTATED RINGERS IV SOLN
INTRAVENOUS | Status: DC
  Administered 2019-03-15: 03:00:00 via INTRAVENOUS

## 2019-03-15 MED ORDER — ACYCLOVIR 400 MG PO TABS
400.0000 mg | ORAL_TABLET | Freq: Three times a day (TID) | ORAL | Status: DC
  Filled 2019-03-15: qty 1

## 2019-03-15 MED ORDER — ONDANSETRON HCL 4 MG/2ML IJ SOLN: 4.0000 mg | INTRAMUSCULAR | Status: DC | PRN

## 2019-03-15 MED ORDER — HYDRALAZINE HCL 20 MG/ML IJ SOLN
INTRAMUSCULAR | Status: AC
  Filled 2019-03-15: qty 1

## 2019-03-15 MED ORDER — OXYTOCIN 40 UNITS IN NORMAL SALINE INFUSION - SIMPLE MED
1.0000 m[IU]/min | INTRAVENOUS | Status: DC
  Administered 2019-03-15: 2 m[IU]/min via INTRAVENOUS
  Administered 2019-03-15: 8 m[IU]/min via INTRAVENOUS

## 2019-03-15 MED ORDER — ONDANSETRON HCL 4 MG PO TABS: 4.0000 mg | ORAL_TABLET | ORAL | Status: DC | PRN

## 2019-03-15 MED ORDER — FENTANYL CITRATE (PF) 100 MCG/2ML IJ SOLN: 100.0000 ug | INTRAMUSCULAR | Status: DC | PRN

## 2019-03-15 MED ORDER — LACTATED RINGERS IV SOLN: 500.0000 mL | Freq: Once | INTRAVENOUS | Status: DC

## 2019-03-15 MED ORDER — LABETALOL HCL 5 MG/ML IV SOLN: 20.0000 mg | INTRAVENOUS | Status: DC | PRN

## 2019-03-15 MED ORDER — LIDOCAINE HCL (PF) 1 % IJ SOLN
INTRAMUSCULAR | Status: DC | PRN
  Administered 2019-03-15 (×2): 4 mL via EPIDURAL

## 2019-03-15 MED ORDER — DIPHENHYDRAMINE HCL 50 MG/ML IJ SOLN: 12.5000 mg | INTRAMUSCULAR | Status: DC | PRN

## 2019-03-15 MED ORDER — DIBUCAINE (PERIANAL) 1 % EX OINT: 1.0000 "application " | TOPICAL_OINTMENT | CUTANEOUS | Status: DC | PRN

## 2019-03-15 MED ORDER — ACETAMINOPHEN 325 MG PO TABS
650.0000 mg | ORAL_TABLET | ORAL | Status: DC | PRN
  Administered 2019-03-15: 650 mg via ORAL
  Filled 2019-03-15: qty 2

## 2019-03-15 MED ORDER — TETANUS-DIPHTH-ACELL PERTUSSIS 5-2.5-18.5 LF-MCG/0.5 IM SUSP: 0.5000 mL | Freq: Once | INTRAMUSCULAR | Status: DC

## 2019-03-15 MED ORDER — ZOLPIDEM TARTRATE 5 MG PO TABS: 5.0000 mg | ORAL_TABLET | Freq: Every evening | ORAL | Status: DC | PRN

## 2019-03-15 MED ORDER — TERBUTALINE SULFATE 1 MG/ML IJ SOLN: 0.2500 mg | Freq: Once | INTRAMUSCULAR | Status: DC | PRN

## 2019-03-15 MED ORDER — MAGNESIUM SULFATE BOLUS VIA INFUSION
4.0000 g | Freq: Once | INTRAVENOUS | Status: AC
  Administered 2019-03-15: 4 g via INTRAVENOUS
  Filled 2019-03-15: qty 500

## 2019-03-15 MED ORDER — HYDRALAZINE HCL 20 MG/ML IJ SOLN
10.0000 mg | INTRAMUSCULAR | Status: DC | PRN
  Administered 2019-03-15: 10 mg via INTRAVENOUS

## 2019-03-15 MED ORDER — EPHEDRINE 5 MG/ML INJ: 10.0000 mg | INTRAVENOUS | Status: DC | PRN

## 2019-03-15 MED ORDER — IBUPROFEN 600 MG PO TABS
600.0000 mg | ORAL_TABLET | Freq: Four times a day (QID) | ORAL | Status: DC
  Administered 2019-03-15 – 2019-03-17 (×8): 600 mg via ORAL
  Filled 2019-03-15 (×8): qty 1

## 2019-03-15 MED ORDER — SIMETHICONE 80 MG PO CHEW: 80.0000 mg | CHEWABLE_TABLET | ORAL | Status: DC | PRN

## 2019-03-15 MED ORDER — ONDANSETRON HCL 4 MG/2ML IJ SOLN: 4.0000 mg | Freq: Four times a day (QID) | INTRAMUSCULAR | Status: DC | PRN

## 2019-03-15 MED ORDER — PHENYLEPHRINE 40 MCG/ML (10ML) SYRINGE FOR IV PUSH (FOR BLOOD PRESSURE SUPPORT): 80.0000 ug | PREFILLED_SYRINGE | INTRAVENOUS | Status: DC | PRN

## 2019-03-15 MED ORDER — SENNOSIDES-DOCUSATE SODIUM 8.6-50 MG PO TABS
2.0000 | ORAL_TABLET | ORAL | Status: DC
  Administered 2019-03-16 (×2): 2 via ORAL
  Filled 2019-03-15 (×2): qty 2

## 2019-03-15 MED ORDER — PRENATAL MULTIVITAMIN CH
1.0000 | ORAL_TABLET | Freq: Every day | ORAL | Status: DC
  Administered 2019-03-15 – 2019-03-17 (×3): 1 via ORAL
  Filled 2019-03-15 (×3): qty 1

## 2019-03-15 MED ORDER — LIDOCAINE-EPINEPHRINE (PF) 2 %-1:200000 IJ SOLN
INTRAMUSCULAR | Status: DC | PRN
  Administered 2019-03-15: 7 mL via EPIDURAL

## 2019-03-15 MED ORDER — PHENYLEPHRINE 40 MCG/ML (10ML) SYRINGE FOR IV PUSH (FOR BLOOD PRESSURE SUPPORT)
80.0000 ug | PREFILLED_SYRINGE | INTRAVENOUS | Status: DC | PRN
  Filled 2019-03-15: qty 10

## 2019-03-15 MED ORDER — ACETAMINOPHEN 325 MG PO TABS
650.0000 mg | ORAL_TABLET | ORAL | Status: DC | PRN
  Administered 2019-03-16: 650 mg via ORAL
  Filled 2019-03-15: qty 2

## 2019-03-15 MED ORDER — DIPHENHYDRAMINE HCL 25 MG PO CAPS: 25.0000 mg | ORAL_CAPSULE | Freq: Four times a day (QID) | ORAL | Status: DC | PRN

## 2019-03-15 MED ORDER — BENZOCAINE-MENTHOL 20-0.5 % EX AERO
1.0000 "application " | INHALATION_SPRAY | CUTANEOUS | Status: DC | PRN
  Administered 2019-03-16: 1 via TOPICAL
  Filled 2019-03-15: qty 56

## 2019-03-15 MED ORDER — MAGNESIUM SULFATE 40 G IN LACTATED RINGERS - SIMPLE
INTRAVENOUS | Status: AC
  Filled 2019-03-15: qty 500

## 2019-03-15 MED ORDER — SOD CITRATE-CITRIC ACID 500-334 MG/5ML PO SOLN: 30.0000 mL | ORAL | Status: DC | PRN

## 2019-03-15 MED ORDER — OXYTOCIN BOLUS FROM INFUSION
500.0000 mL | Freq: Once | INTRAVENOUS | Status: AC
  Administered 2019-03-15: 500 mL via INTRAVENOUS

## 2019-03-15 MED ORDER — OXYCODONE-ACETAMINOPHEN 5-325 MG PO TABS: 2.0000 | ORAL_TABLET | ORAL | Status: DC | PRN

## 2019-03-15 MED ORDER — OXYCODONE-ACETAMINOPHEN 5-325 MG PO TABS: 1.0000 | ORAL_TABLET | ORAL | Status: DC | PRN

## 2019-03-15 MED ORDER — WITCH HAZEL-GLYCERIN EX PADS: 1.0000 "application " | MEDICATED_PAD | CUTANEOUS | Status: DC | PRN

## 2019-03-15 MED ORDER — LACTATED RINGERS IV SOLN
INTRAVENOUS | Status: DC
  Administered 2019-03-15 – 2019-03-16 (×2): via INTRAVENOUS

## 2019-03-15 MED ORDER — AMLODIPINE BESYLATE 5 MG PO TABS
5.0000 mg | ORAL_TABLET | Freq: Every day | ORAL | Status: DC
  Administered 2019-03-15: 5 mg via ORAL
  Filled 2019-03-15: qty 1

## 2019-03-15 NOTE — MAU Note (Signed)
Upon entering pt room, pt reports trickle of fluid. Reports this is the first occurrence. No obvious fluid on perineum but small amount on pt fingers. Pt reports that she had intercourse about 2 hours prior to arrival. Crist Fat slide collected.

## 2019-03-15 NOTE — H&P (Signed)
LABOR AND DELIVERY ADMISSION HISTORY AND PHYSICAL NOTE  Virginia Mills is a 23 y.o. female [phone] with IUP at 9031w1d by LMP c/w 7 week sono presenting for labor check to MAU with SROM while in the room. Fern slide positive and patient with pooling on  CNM's exam.  She reports positive fetal movement. She denies vaginal bleeding.  Prenatal History/Complications: PNC at [facility]  Pregnancy complications:  - HSV-2 on prophylaxis without prodromal symptoms, current/recent outbreak  -h/o THC and oxycodone use, UDS pending  -h/o prior pregnancy with IUGR and subsequent VSD repair, no showed fetal echo x2  -asymptomatic bacteriuria, treated with negative TOC  -depression, no medications  -breast abscess I&D performed on 3/20 in ED   Past Medical History: Past Medical History:  Diagnosis Date  . Bell's palsy   . HSV-2 infection     Past Surgical History: Past Surgical History:  Procedure Laterality Date  . INDUCED ABORTION    . INDUCED ABORTION      Obstetrical History: OB History    Gravida  4   Para  1   Term  1   Preterm      AB  2   Living  1     SAB      TAB  2   Ectopic      Multiple      Live Births  1           Social History: Social History   Socioeconomic History  . Marital status: Single    Spouse name: Not on file  . Number of children: 1  . Years of education: 4210   . Highest education level: 10th grade  Occupational History  . Not on file  Social Needs  . Financial resource strain: Not very hard  . Food insecurity:    Worry: Sometimes true    Inability: Not on file  . Transportation needs:    Medical: Yes    Non-medical: No  Tobacco Use  . Smoking status: Former Smoker    Packs/day: 1.00    Types: Cigarettes  . Smokeless tobacco: Never Used  Substance and Sexual Activity  . Alcohol use: Not Currently    Comment: on weekends  . Drug use: No    Types: Marijuana    Comment: no longer uses  . Sexual activity: Yes    Birth  control/protection: None  Lifestyle  . Physical activity:    Days per week: 0 days    Minutes per session: 0 min  . Stress: Rather much  Relationships  . Social connections:    Talks on phone: Three times a week    Gets together: Twice a week    Attends religious service: Never    Active member of club or organization: No    Attends meetings of clubs or organizations: Never    Relationship status: Patient refused  Other Topics Concern  . Not on file  Social History Narrative  . Not on file    Family History: Family History  Problem Relation Age of Onset  . Diabetes Father   . Diabetes Paternal Grandmother   . Hypertension Paternal Grandmother   . Diabetes Paternal Grandfather   . Hypertension Paternal Grandfather   . Heart attack Paternal Grandfather   . Cancer Maternal Aunt   . Autism Cousin     Allergies: No Known Allergies  Medications Prior to Admission  Medication Sig Dispense Refill Last Dose  . acyclovir (ZOVIRAX) 400 MG tablet  Take 1 tablet (400 mg total) by mouth 3 (three) times daily. 90 tablet 2 03/14/2019 at Unknown time  . Blood Pressure Monitoring (BLOOD PRESSURE MONITOR AUTOMAT) DEVI Take BP at home daily.  Alert [place] if >140/90 more than once. 1 Device 0 03/14/2019 at Unknown time  . diphenhydrAMINE-APAP, sleep, (TYLENOL PM EXTRA STRENGTH PO) Take by mouth at bedtime.   03/14/2019 at Unknown time  . Prenatal Vit-Fe Fumarate-FA (PRENATAL VITAMINS) 28-0.8 MG TABS Take 1 tablet by mouth daily. 30 tablet 12 03/14/2019 at Unknown time  . traMADol (ULTRAM) 50 MG tablet Take 1 tablet (50 mg total) by mouth every 6 (six) hours as needed for severe pain. 15 tablet 0 03/14/2019 at Unknown time     Review of Systems  All systems reviewed and negative except as stated in HPI  Physical Exam Blood pressure 126/65, pulse 67, temperature 98.5 F (36.9 C), temperature source Oral, resp. rate 16, weight 71.4 kg, last menstrual period 06/20/2018. General appearance: alert,  oriented, NAD, very uncomfortable with ctx  Lungs: normal respiratory effort Heart: regular rate Abdomen: soft, non-tender; gravid, FH appropriate for GA Extremities: No calf swelling or tenderness Presentation: cephalic Fetal monitoring: 140 bpm, moderate variability, +acels, no decels  Uterine activity: q3 min  Dilation: 3 Effacement (%): 50 Station: -1 Exam by:: Virginia Eng RN  Prenatal labs: ABO, Rh: O/Positive/-- (10/24 1628) Antibody: Negative (03/13 0845) Rubella: 5.06 (10/24 1628) RPR: Non Reactive (03/13 0845)  HBsAg: Negative (10/24 1628)  HIV: Non Reactive (03/13 0845)  GC/Chlamydia: Negative  GBS: Negative (04/20 1600)  2-hr GTT: Normal  Genetic screening:  Declined, did not perform 2nd part of integrated screen  Anatomy US: Normal   Prenatal Transfer Tool  Maternal Diabetes: No Genetic Screening: Declined Maternal Ultrasounds/Referrals: Normal Fetal Ultrasounds or other Referrals:  None Maternal Substance Abuse:  Yes:  Type: Marijuana, Prescription drugs Significant Maternal Medications:  Meds include: Other:  Valtrex Significant Maternal Lab Results: Lab values include: Group B Strep negative  Results for orders placed or performed during the hospital encounter of 03/15/19 (from the past 24 hour(s))  [facility] Time: 03/15/19  2:44 AM  Result Value Ref Range   POCT Fern Test Positive = ruptured amniotic membanes     Patient Active Problem List   Diagnosis Date Noted  . Asymptomatic bacteriuria during pregnancy 09/17/2018  . Supervision of normal pregnancy 08/27/2018  . History of prior pregnancy with IUGR newborn 08/27/2018  . Depression 07/30/2018  . 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 Marijuana use 04/24/2013    Assessment: Virginia Mills is a 23 y.o. [phone] at [redacted]w[redacted]d here for SROM. Noted to have moderately elevated BPs in MAU, asymptomatic. Will obtain PIH labs.   #Labor: Expectant management.  #Pain: Epidural just placed.   #FWB: Cat I  #ID:  GBS neg  #MOF: Breast  #MOC:Undecided but requests contraception. Will discuss PP>  #Circ:  Outpatient   De Hollingshead 03/15/2019, 2:51 AM

## 2019-03-15 NOTE — Discharge Summary (Signed)
Postpartum Discharge Summary     Patient Name: Virginia Mills DOB: [birth year] MRN: [id number]  Date of admission: 03/15/2019 Delivering Provider: Sharyon CableOGERS, VERONICA C   Date of discharge: 03/17/2019  Admitting diagnosis: contractions 39 weeks  Intrauterine pregnancy: 4360w1d     Secondary diagnosis:  Principal Problem:   Severe preeclampsia Active Problems:   Marijuana use   HSV-2 seropositive   Depression   History of prior pregnancy with IUGR newborn   Indication for care in labor or delivery   SVD (spontaneous vaginal delivery)   Shoulder dystocia during labor and delivery, delivered   Gestational hypertension  Additional problems:      Discharge diagnosis: Term Pregnancy Delivered and Preeclampsia (severe)                                                                                                Post partum procedures:magnesium  Augmentation: Pitocin  Complications: Shoulder dystocia  Hospital course:  Onset of Labor With Vaginal Delivery     23 y.o. yo [id number] at 8560w1d was admitted in Latent Labor on 03/15/2019. Spontaneous rupture of membranes, subsequent severe preeclampsia. Patient had an uncomplicated labor course as follows:  Membrane Rupture Time/Date: 2:15 AM ,03/15/2019   Intrapartum Procedures: Episiotomy: None [1]                                         Lacerations:  None [1]  Patient had a delivery of a Viable infant. 03/15/2019  Information for the patient's newborn:  Ranee GosselinCarter, Boy Lavonya [[id number]][[id number]]  Delivery Method: Vag-Spont    Pateint had an uncomplicated postpartum course, was on magnesium sulfate x 24 hrs post delivery. Started on norvasc  5 mg daily and titrated to 10 mg daily.  She is ambulating, tolerating a regular diet, passing flatus, and urinating well. Patient is discharged home in stable condition on 03/17/19.   Magnesium Sulfate recieved: Yes BMZ received: No  Physical exam  Vitals:   03/16/19 1540 03/16/19 1941 03/16/19 2333  03/17/19 0410  BP: 123/60 (!) 145/81 (!) 135/93 117/72  Pulse: 68 64 64 61  Resp: 18 18 16 16   Temp: 98.3 F (36.8 C) 98.5 F (36.9 C) 98.1 F (36.7 C) 98 F (36.7 C)  TempSrc: Oral Oral Oral Oral  SpO2: 99% 99%  100%  Weight:      Height:       General: alert, cooperative and no distress Lochia: appropriate Uterine Fundus: firm Incision: N/A DVT Evaluation: No evidence of DVT seen on physical exam. Labs: Lab Results  Component Value Date   WBC 9.7 03/16/2019   HGB 10.1 (L) 03/16/2019   HCT 31.2 (L) 03/16/2019   MCV 84.8 03/16/2019   PLT 134 (L) 03/16/2019   CMP Latest Ref Rng & Units 03/16/2019  Glucose 70 - 99 mg/dL 89  BUN 6 - 20 mg/dL <2(Z<5(L)  Creatinine 3.660.44 - 1.00 mg/dL 4.400.72  Sodium 347135 - 425145 mmol/L 136  Potassium 3.5 - 5.1 mmol/L 3.9  Chloride 98 -  111 mmol/L 97(L)  CO2 22 - 32 mmol/L 27  Calcium 8.9 - 10.3 mg/dL 7.8(L)  Total Protein 6.5 - 8.1 g/dL 8.9(Q)  Total Bilirubin 0.3 - 1.2 mg/dL 0.4  Alkaline Phos 38 - 126 U/L 182(H)  AST 15 - 41 U/L 26  ALT 0 - 44 U/L 20    Discharge instruction: per After Visit Summary and "Baby and Me Booklet".  After visit meds:  Allergies as of 03/17/2019   No Known Allergies     Medication List    STOP taking these medications   acyclovir 400 MG tablet Commonly known as:  ZOVIRAX   traMADol 50 MG tablet Commonly known as:  ULTRAM     TAKE these medications   amLODipine 10 MG tablet Commonly known as:  NORVASC Take 1 tablet (10 mg total) by mouth daily.   Blood Pressure Monitor Automat Devi Take BP at home daily.  Alert [place] if >140/90 more than once.   ibuprofen 600 MG tablet Commonly known as:  ADVIL Take 1 tablet (600 mg total) by mouth every 6 (six) hours.   Prenatal Vitamins 28-0.8 MG Tabs Take 1 tablet by mouth daily.   TYLENOL PM EXTRA STRENGTH PO Take 2 tablets by mouth at bedtime.       Diet: routine diet  Activity: Advance as tolerated. Pelvic rest for 6 weeks.   Outpatient follow up:4  weeks Follow up Appt: Future Appointments  Date Time Provider Department Center  03/22/2019  2:00 PM [facility] NURSE [facility]  04/19/2019  2:30 PM Cresenzo-Dishmon, Scarlette Calico, CNM [facility]   Follow up Visit: Follow-up Information    [facility]. Schedule an appointment as soon as possible for a visit in 4 week(s).   Specialty:  Obstetrics and Gynecology Contact information: [address] [place] [address] [phone]           Please schedule this patient for Postpartum visit in: 4 weeks with the following provider: Any provider For C/S patients schedule nurse incision check in weeks 2 weeks: no High risk pregnancy complicated by: HTN Delivery mode:  SVD Anticipated Birth Control:  other/unsure PP Procedures needed: BP check  Schedule Integrated BH visit: no   Newborn Data: Live born female  Birth Weight: 6 lb 14.8 oz (3140 g) APGAR: 6, 9  Newborn Delivery   Time head delivered:  03/15/2019 10:56:00 Birth date/time:  03/15/2019 10:57:00 Delivery type:  Vaginal, Spontaneous     Baby Feeding: Breast Disposition:home with mother    Baldemar Lenis, M.D. Attending Center for [employer] (Faculty Practice) 03/17/19 9:36 AM

## 2019-03-15 NOTE — Anesthesia Preprocedure Evaluation (Signed)
Anesthesia Evaluation  Patient identified by MRN, date of birth, ID band Patient awake    Reviewed: Allergy & Precautions, Patient's Chart, lab work & pertinent test results  History of Anesthesia Complications Negative for: history of anesthetic complications  Airway Mallampati: II  TM Distance: >3 FB Neck ROM: Full    Dental no notable dental hx.    Pulmonary former smoker,    Pulmonary exam normal breath sounds clear to auscultation       Cardiovascular negative cardio ROS Normal cardiovascular exam Rhythm:Regular Rate:Normal     Neuro/Psych negative neurological ROS  negative psych ROS   GI/Hepatic negative GI ROS, (+)     substance abuse  marijuana use,   Endo/Other  negative endocrine ROS  Renal/GU negative Renal ROS  negative genitourinary   Musculoskeletal negative musculoskeletal ROS (+)   Abdominal   Peds negative pediatric ROS (+)  Hematology negative hematology ROS (+)   Anesthesia Other Findings   Reproductive/Obstetrics (+) Pregnancy                             Anesthesia Physical Anesthesia Plan  ASA: II  Anesthesia Plan: Epidural   Post-op Pain Management:    Induction:   PONV Risk Score and Plan: Treatment may vary due to age or medical condition  Airway Management Planned: Natural Airway  Additional Equipment:   Intra-op Plan:   Post-operative Plan:   Informed Consent: I have reviewed the patients History and Physical, chart, labs and discussed the procedure including the risks, benefits and alternatives for the proposed anesthesia with the patient or authorized representative who has indicated his/her understanding and acceptance.       Plan Discussed with: CRNA  Anesthesia Plan Comments:         Anesthesia Quick Evaluation

## 2019-03-15 NOTE — Anesthesia Postprocedure Evaluation (Signed)
Anesthesia Post Note  Patient: Virginia Mills  Procedure(s) Performed: AN AD HOC LABOR EPIDURAL     Patient location during evaluation: Mother Baby Anesthesia Type: Epidural Level of consciousness: awake and alert Pain management: pain level controlled Vital Signs Assessment: post-procedure vital signs reviewed and stable Respiratory status: spontaneous breathing, nonlabored ventilation and respiratory function stable Cardiovascular status: stable Postop Assessment: no headache, no backache, epidural receding, no apparent nausea or vomiting, patient able to bend at knees, able to ambulate and adequate PO intake Anesthetic complications: no    Last Vitals:  Vitals:   03/15/19 1613 03/15/19 1713  BP: 120/68 127/87  Pulse: 67 82  Resp:    Temp:    SpO2: 99% 98%    Last Pain:  Vitals:   03/15/19 1420  TempSrc: Oral  PainSc:    Pain Goal:                   [employer]

## 2019-03-15 NOTE — Anesthesia Procedure Notes (Signed)
Epidural Patient location during procedure: OB Start time: 03/15/2019 3:44 AM End time: 03/15/2019 3:47 AM  Staffing Anesthesiologist: Kaylyn Layer, MD Performed: anesthesiologist   Preanesthetic Checklist Completed: patient identified, pre-op evaluation, timeout performed, IV checked, risks and benefits discussed and monitors and equipment checked  Epidural Patient position: sitting Prep: site prepped and draped and DuraPrep Patient monitoring: continuous pulse ox, blood pressure, heart rate and cardiac monitor Approach: midline Location: L3-L4 Injection technique: LOR air  Needle:  Needle type: Tuohy  Needle gauge: 17 G Needle length: 9 cm Needle insertion depth: 5 cm Catheter type: closed end flexible Catheter size: 19 Gauge Catheter at skin depth: 10 cm Test dose: negative and Other (1% lidocaine)  Assessment Events: blood not aspirated, injection not painful, no injection resistance, negative IV test and no paresthesia  Additional Notes Patient identified. Risks, benefits, and alternatives discussed with patient including but not limited to bleeding, infection, nerve damage, paralysis, failed block, incomplete pain control, headache, blood pressure changes, nausea, vomiting, reactions to medication, itching, and postpartum back pain. Confirmed with bedside nurse the patient's most recent platelet count. Confirmed with patient that they are not currently taking any anticoagulation, have any bleeding history, or any family history of bleeding disorders. Patient expressed understanding and wished to proceed. All questions were answered. Sterile technique was used throughout the entire procedure. Please see nursing notes for vital signs.  Somewhat difficult placement due to patient movement. LOR to air after one needle redirection with catheter threaded easily. Test dose was given through epidural catheter and negative prior to continuing to dose epidural or start infusion.  Warning signs of high block given to the patient including shortness of breath, tingling/numbness in hands, complete motor block, or any concerning symptoms with instructions to call for help. Patient was given instructions on fall risk and not to get out of bed. All questions and concerns addressed with instructions to call with any issues or inadequate analgesia.  Reason for block:procedure for pain

## 2019-03-15 NOTE — Progress Notes (Signed)
Pt refusing BP cuff on.

## 2019-03-15 NOTE — Progress Notes (Signed)
Tylenol helped with HA pain.

## 2019-03-15 NOTE — Progress Notes (Signed)
After delivery patient complains of frontal HA, rates pain 5/10- request Tylenol for HA.   Blood pressure now elevated to severe range after delivery. Vitals:   03/15/19 1000 03/15/19 1100 03/15/19 1115 03/15/19 1133  BP: (!) 169/102 (!) 108/92 (!) 177/60 (!) 186/111  Pulse: 71 66 67 67  Resp: 20 15 15 16   Temp:      TempSrc:      SpO2:      Weight:      Height:       C/w Dr Vergie Living on new onset HA and severe range BP, recommends Hydralazine and Magnesium for treatment.   Change in plan of care discussed with Charge RN and RN of patient, patient to receive Tylenol for HA, hydralazine and Mag. Patient will be transferred to [facility] Speciality care instead of Mother Baby unit.   Reassess labs of CBC and CMP around 1500 (12 hours from previous draw).   Sharyon Cable, CNM 03/15/19, 11:58 AM

## 2019-03-15 NOTE — Progress Notes (Signed)
LABOR PROGRESS NOTE  Virginia Mills is a 23 y.o. [phone] at [redacted]w[redacted]d  admitted for SROM @0215   Subjective: Patient breathing through contractions, reports increased pelvic pain with epidural, denies pressure in bottom   Objective: BP 136/85   Pulse (!) 56   Temp 98 F (36.7 C) (Oral)   Resp 17   Ht 5\' 5"  (1.651 m)   Wt 71.4 kg   LMP 06/20/2018 (Exact Date)   SpO2 96%   BMI 26.18 kg/m  or  Vitals:   03/15/19 0740 03/15/19 0800 03/15/19 0830 03/15/19 0902  BP:  119/72 126/77 136/85  Pulse:  60 67 (!) 56  Resp:  17 17 17   Temp: 98 F (36.7 C)     TempSrc: Oral     SpO2:      Weight:      Height:        IUPC placed @0915  Dilation: 7.5 Effacement (%): 80 Cervical Position: Posterior Station: 0 Presentation: Vertex Exam by:: Lanice Shirts CNM  FHT: baseline rate 130, moderate varibility, +accel, late and variable decel Toco: 3-4  Labs: Lab Results  Component Value Date   WBC 8.0 03/15/2019   HGB 10.3 (L) 03/15/2019   HCT 32.2 (L) 03/15/2019   MCV 85.2 03/15/2019   PLT 140 (L) 03/15/2019    Patient Active Problem List   Diagnosis Date Noted  . Indication for care in labor or delivery 03/15/2019  . Asymptomatic bacteriuria during pregnancy 09/17/2018  . Supervision of normal pregnancy 08/27/2018  . History of prior pregnancy with IUGR newborn 08/27/2018  . Depression 07/30/2018  . 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 Marijuana use 04/24/2013    Assessment / Plan: 23 y.o. G4P1021 at [redacted]w[redacted]d here for SROM @0215 , clear fluid   Labor: Progressing well on pitocin, IUPC placed due to fetal decelerations to assess strength of contractions, late decelerations resolved with position change. Recheck cervical change in 3-4 hours or as needed.  Fetal Wellbeing:  Cat II  Pain Control:  Epidural  Anticipated MOD:  SVD  Sharyon Cable, CNM 03/15/2019, 9:19 AM

## 2019-03-15 NOTE — MAU Note (Signed)
Pt presents to MAU c/o ctx every 5 min. No bleeding or LOF. +FM.

## 2019-03-16 LAB — COMPREHENSIVE METABOLIC PANEL
ALT: 20 U/L (ref 0–44)
AST: 26 U/L (ref 15–41)
Albumin: 2.3 g/dL — ABNORMAL LOW (ref 3.5–5.0)
Alkaline Phosphatase: 182 U/L — ABNORMAL HIGH (ref 38–126)
Anion gap: 12 (ref 5–15)
BUN: <5 mg/dL — ABNORMAL LOW (ref 6–20)
CO2: 27 mmol/L (ref 22–32)
Calcium: 7.8 mg/dL — ABNORMAL LOW (ref 8.9–10.3)
Chloride: 97 mmol/L — ABNORMAL LOW (ref 98–111)
Creatinine, Ser: 0.72 mg/dL (ref 0.44–1.00)
GFR calc Af Amer: >60 mL/min (ref >60)
GFR calc non Af Amer: >60 mL/min (ref >60)
Glucose, Bld: 89 mg/dL (ref 70–99)
Potassium: 3.9 mmol/L (ref 3.5–5.1)
Sodium: 136 mmol/L (ref 135–145)
Total Bilirubin: 0.4 mg/dL (ref 0.3–1.2)
Total Protein: 5.7 g/dL — ABNORMAL LOW (ref 6.5–8.1)

## 2019-03-16 LAB — CBC
HCT: 31.2 % — ABNORMAL LOW (ref 36.0–46.0)
Hemoglobin: 10.1 g/dL — ABNORMAL LOW (ref 12.0–15.0)
MCH: 27.4 pg (ref 26.0–34.0)
MCHC: 32.4 g/dL (ref 30.0–36.0)
MCV: 84.8 fL (ref 80.0–100.0)
Platelets: 134 10*3/uL — ABNORMAL LOW (ref 150–400)
RBC: 3.68 MIL/uL — ABNORMAL LOW (ref 3.87–5.11)
RDW: 14.6 % (ref 11.5–15.5)
WBC: 9.7 10*3/uL (ref 4.0–10.5)
nRBC: 0 % (ref 0.0–0.2)

## 2019-03-16 MED ORDER — AMLODIPINE BESYLATE 5 MG PO TABS
5.0000 mg | ORAL_TABLET | Freq: Two times a day (BID) | ORAL | Status: DC
  Administered 2019-03-16 (×2): 5 mg via ORAL
  Filled 2019-03-16 (×2): qty 1

## 2019-03-16 NOTE — Progress Notes (Signed)
Post Partum Day 1: Vaginal delivery at [redacted]w[redacted]d, Severe PEC Subjective: Patient denies any headaches, visual symptoms, RUQ/epigastric pain or other concerning symptoms. No complaints, up ad lib, voiding and tolerating PO.  Objective: Blood pressure (!) 152/86, pulse 61, temperature 97.9 F (36.6 C), temperature source Oral, resp. rate 18, height 5\' 5"  (1.651 m), weight 71.4 kg, last menstrual period 06/20/2018, SpO2 99 %. Patient Vitals for the past 24 hrs:  BP Temp Temp src Pulse Resp SpO2  03/16/19 0826 (!) 152/86 97.9 F (36.6 C) Oral 61 18 99 %  03/16/19 0417 134/90 97.9 F (36.6 C) Oral 65 16 100 %  03/16/19 0401 - - - - 16 -  03/16/19 0230 - - - - 16 -  03/16/19 0117 - - - - 16 -  03/16/19 0009 - - - - 18 -  03/15/19 2335 131/81 98.3 F (36.8 C) Oral 65 18 100 %  03/15/19 2232 - - - - 17 -  03/15/19 2130 - - - - 16 -  03/15/19 2051 (!) 149/94 - - 68 17 100 %  03/15/19 1926 (!) 150/96 98.9 F (37.2 C) Oral 64 18 100 %  03/15/19 1925 - - - - - 100 %  03/15/19 1900 - - - - 17 -  03/15/19 1815 - - - - - 100 %  03/15/19 1814 (!) 152/91 - - 61 - 100 %  03/15/19 1800 - - - - 18 -  03/15/19 1713 127/87 - - 82 - 98 %  03/15/19 1700 - - - - 15 -  03/15/19 1613 120/68 - - 67 - 99 %  03/15/19 1600 - - - - 15 -  03/15/19 1500 (!) 154/100 - - 74 16 100 %  03/15/19 1420 (!) 146/84 98.3 F (36.8 C) Oral 72 16 100 %  03/15/19 1400 - - - - 16 -  03/15/19 1312 (!) 166/86 98.3 F (36.8 C) Oral 65 16 -  03/15/19 1247 (!) 160/89 - - 66 16 -  03/15/19 1230 (!) 165/90 - - (!) 59 15 -  03/15/19 1215 (!) 153/81 - - 65 15 -  03/15/19 1200 (!) 131/109 - - 76 15 -  03/15/19 1145 (!) 148/92 - - 68 15 -  03/15/19 1133 (!) 186/111 - - 67 16 -  03/15/19 1115 (!) 177/60 - - 67 15 -  03/15/19 1102 (!) 140/98 - - (!) 59 - -  03/15/19 1100 (!) 108/92 - - 66 15 -  03/15/19 1000 (!) 169/102 - - 71 20 -    Physical Exam:  General: alert and no distress Lochia: appropriate Uterine Fundus: firm DVT  Evaluation: No evidence of DVT seen on physical exam. Negative Homan's sign. No cords or calf tenderness.  Recent Labs    03/15/19 1556 03/16/19 0529  HGB 10.3* 10.1*  HCT 32.0* 31.2*    Assessment/Plan: Norvasc increased to 10 mg daily Magnesium sulfate to end at 1100, watch BP closely Plan for discharge on PPD#2-3, Breastfeeding and Contraception undecided for now   LOS: 1 day   Jaynie Collins, MD 03/16/2019, 9:44 AM

## 2019-03-16 NOTE — Progress Notes (Signed)
Pt switched formula for one feeding to Enfamil brought from home. Stated that she wanted to try the formula she would be feeding baby at home.   We discussed importance of using pre-mixed, hospital provided formula and pt agreed to continue using Gerber formula provided by hospital.   Gave pt handout on appropriate feeding amounts for baby's age and discussed plan for next feeding. Pt verbalized understanding of the formula volume guidelines.

## 2019-03-17 ENCOUNTER — Encounter (HOSPITAL_COMMUNITY): Payer: Self-pay | Admitting: *Deleted

## 2019-03-17 DIAGNOSIS — O139 Gestational [pregnancy-induced] hypertension without significant proteinuria, unspecified trimester: Secondary | ICD-10-CM

## 2019-03-17 DIAGNOSIS — O1410 Severe pre-eclampsia, unspecified trimester: Secondary | ICD-10-CM | POA: Diagnosis present

## 2019-03-17 MED ORDER — IBUPROFEN 600 MG PO TABS: 600.0000 mg | ORAL_TABLET | Freq: Four times a day (QID) | ORAL | 0 refills | Status: DC

## 2019-03-17 MED ORDER — AMLODIPINE BESYLATE 10 MG PO TABS: 10.0000 mg | ORAL_TABLET | Freq: Every day | ORAL | 0 refills | Status: DC

## 2019-03-17 MED ORDER — AMLODIPINE BESYLATE 10 MG PO TABS
10.0000 mg | ORAL_TABLET | Freq: Every day | ORAL | Status: DC
  Administered 2019-03-17: 10 mg via ORAL
  Filled 2019-03-17: qty 1

## 2019-03-17 NOTE — Progress Notes (Signed)
Teaching complete home with infant

## 2019-03-17 NOTE — Clinical Social Work Maternal (Signed)
CLINICAL SOCIAL WORK MATERNAL/CHILD NOTE  Patient Details  Name: Virginia Mills MRN: [id number] Date of Birth: Dec 20, 1995  Date:  03/17/2019  Clinical Social Worker Initiating Note:  Abundio Miu, [place] Date/Time: Initiated:  03/17/19/1034     Child's Name:  Mikey Bussing    Biological Parents:  Mother, Father(Father: Nicola Police)   Need for Interpreter:  None   Reason for Referral:  Behavioral Health Concerns, Current Substance Use/Substance Use During Pregnancy    Address:  [address] [place] [address] [place], [place] [address]   Phone number:  [phone] (home)     Additional phone number:   Household Members/Support Persons (HM/SP):   Household Member/Support Person 1, Household Member/Support Person 2   HM/SP Name Relationship DOB or Age  HM/SP -[place] FOB    HM/SP -2 Gean Maidens. son 11/16/13  HM/SP -3        HM/SP -4        HM/SP -5        HM/SP -6        HM/SP -7        HM/SP -8          Natural Supports (not living in the home):  Immediate Family(Sister;; Radio producer)   Professional Supports: None   Employment: Unemployed   Type of Work:     Education:  9 to 11 years(10th Grade)   Homebound arranged: No  Financial Resources:  [employer]   Other Resources:  Physicist, medical (Plans to apply for [facility])   Cultural/Religious Considerations Which May Impact Care:    Strengths:  Ability to meet basic needs , Home prepared for child , Pediatrician chosen   Psychotropic Medications:         Pediatrician:    [place]  Pediatrician List:   [place]      Pediatrician Fax Number:    Risk Factors/Current Problems:  Substance Use    Cognitive State:  Able to Concentrate , Alert , Linear Thinking , Insightful , Goal Oriented    Mood/Affect:  Calm , Interested , Relaxed    CSW Assessment: CSW met with MOB at  bedside to discuss consult for substance use during pregnancy. CSW introduced self and explained reason for consult. MOB was on the phone scheduling a follow up appointment for infant and requested that we speak before FOB returned. MOB was open and engaged during assessment. MOB reported that she resides with FOB and older son. MOB reported that she is unemployed and receives food stamps and plans to apply for [facility]. MOB reported that FOB bought all items infant needed. CSW inquired about MOB's support system, MOB reported that her sister and grandma were her supports. MOB reported that her grandma and sister set everything up at home for infant while she was in the hospital.   CSW inquired about MOB's mental health history. MOB reported that she experienced some depression during her pregnancy due to issues in her relationship. MOB reported that she was having general conversation with her OB GYN when they prescribed her medication for depression. MOB reported that she thought she was breaking up with FOB and thought she would be a single mother again. MOB reported that she and FOB resolved their issues and are still together. MOB reported that her OB GYN prescribed her lexapro to treat depression but she never  took it.  FOB entered the room, CSW asked FOB to leave during assessment with MOB's permission, FOB left voluntarily.    CSW and MOB continued discussion about her mental health. MOB reported that her depressive symptoms resolved on their own and reported no coping skills.  MOB denied any other mental health history and denied postpartum depression with older son. MOB reported that she was stressed out after having her first son because he required open heart surgery. MOB reported that her son is now doing well and is healthy. CSW acknowledged and validated the stress that MOB experienced after having her first child. MOB was engaged during conversation and was open about her mental health history. MOB  denied any current symptoms of depression and reported that her last symptoms were in December. MOB did not demonstrate any acute mental health signs/symptoms. CSW assessed for safety, MOB denied SI, HI and DV. CSW informed MOB that due to her mental health history she may be more susceptible to PPD.   CSW provided education regarding the baby blues period vs. perinatal mood disorders, discussed treatment and gave resources for mental health follow up if concerns arise.  CSW recommends self-evaluation during the postpartum time period using the New Mom Checklist from Postpartum Progress and encouraged MOB to contact a medical professional if symptoms are noted at any time.    CSW provided review of Sudden Infant Death Syndrome (SIDS) precautions. MOB verbalized understanding and reported that infant has a pack and play to sleep in.   CSW informed MOB about the hospital drug policy and inquired about MOB's substance use during pregnancy. MOB reported that she smoked marijuana and her last use was 4 months ago. Per chart review, MOB had a positive UDS on 03/15/19 for THC. 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 MOB reported that she is no longer abusing oxycodone and was prescribed tramadol after having surgery on her breast during pregnancy. MOB reported that her pain is now well controlled with tylenol PM and is no longer taking tramadol. MOB denied CPS history.   CSW will continue to monitor CDS and make a CPS report if warranted. CSW identifies no further need for intervention and no barriers to discharge at this time.  CSW Plan/Description:  Sudden Infant Death Syndrome (SIDS) Education, Perinatal Mood and Anxiety Disorder (PMADs) Education, [place], CSW Will Continue to Monitor Umbilical Cord Tissue Drug Screen Results and Make Report if Barbette Or,  LCSW 03/17/2019, 10:41 AM

## 2019-03-17 NOTE — Discharge Instructions (Signed)
Go to [email or web address].org for more information!  Contraception Choices Contraception, also called birth control, refers to methods or devices that prevent pregnancy. Hormonal methods Contraceptive implant A contraceptive implant is a thin, plastic tube that contains a hormone. It is inserted into the upper part of the arm. It can remain in place for up to 3 years. Progestin-only injections Progestin-only injections are injections of progestin, a synthetic form of the hormone progesterone. They are given every 3 months by a health care provider. Birth control pills Birth control pills are pills that contain hormones that prevent pregnancy. They must be taken once a day, preferably at the same time each day. Birth control patch The birth control patch contains hormones that prevent pregnancy. It is placed on the skin and must be changed once a week for three weeks and removed on the fourth week. A prescription is needed to use this method of contraception. Vaginal ring A vaginal ring contains hormones that prevent pregnancy. It is placed in the vagina for three weeks and removed on the fourth week. After that, the process is repeated with a new ring. A prescription is needed to use this method of contraception. Emergency contraceptive Emergency contraceptives prevent pregnancy after unprotected sex. They come in pill form and can be taken up to 5 days after sex. They work best the sooner they are taken after having sex. Most emergency contraceptives are available without a prescription. This method should not be used as your only form of birth control. Barrier methods Female condom A female condom is a thin sheath that is worn over the penis during sex. Condoms keep sperm from going inside a woman's body. They can be used with a spermicide to increase their effectiveness. They should be disposed after a single use. Female condom A female condom is a soft, loose-fitting sheath that is put into the vagina  before sex. The condom keeps sperm from going inside a woman's body. They should be disposed after a single use.  Intrauterine contraception Intrauterine device (IUD) An IUD is a T-shaped device that is put in a woman's uterus. There are two types:  Hormone IUD.This type contains progestin, a synthetic form of the hormone progesterone. This type can stay in place for 3-5 years.  Copper IUD.This type is wrapped in copper wire. It can stay in place for 10 years.  Permanent methods of contraception Female tubal ligation In this method, a woman's fallopian tubes are sealed, tied, or blocked during surgery to prevent eggs from traveling to the uterus.  Female sterilization This is a procedure to tie off the tubes that carry sperm (vasectomy). After the procedure, the man can still ejaculate fluid (semen).  Summary  Contraception, also called birth control, means methods or devices that prevent pregnancy.  Hormonal methods of contraception include implants, injections, pills, patches, vaginal rings, and emergency contraceptives.  Barrier methods of contraception can include female condoms, female condoms, diaphragms, cervical caps, sponges, and spermicides.  There are two types of IUDs (intrauterine devices). An IUD can be put in a woman's uterus to prevent pregnancy for 3-5 years.  Permanent sterilization can be done through a procedure for males, females, or both. This information is not intended to replace advice given to you by your health care provider. Make sure you discuss any questions you have with your health care provider. Document Released: 10/21/2005 Document Revised: 11/23/2016 Document Reviewed: 11/23/2016 Elsevier Interactive Patient Education  2018 [employer].   Vaginal Delivery, Care After Refer to this sheet  in the next few weeks. These instructions provide you with information about caring for yourself after vaginal delivery. Your health care provider may also give you  more specific instructions. Your treatment has been planned according to current medical practices, but problems sometimes occur. Call your health care provider if you have any problems or questions. What can I expect after the procedure? After vaginal delivery, it is common to have:  Some bleeding from your vagina.  Soreness in your abdomen, your vagina, and the area of skin between your vaginal opening and your anus (perineum).  Pelvic cramps.  Fatigue. Follow these instructions at home: Medicines  Take over-the-counter and prescription medicines only as told by your health care provider.  If you were prescribed an antibiotic medicine, take it as told by your health care provider. Do not stop taking the antibiotic until it is finished. Driving   Do not drive or operate heavy machinery while taking prescription pain medicine.  Do not drive for 24 hours if you received a sedative. Lifestyle  Do not drink alcohol. This is especially important if you are breastfeeding or taking medicine to relieve pain.  Do not use tobacco products, including cigarettes, chewing tobacco, or e-cigarettes. If you need help quitting, ask your health care provider. Eating and drinking  Drink at least 8 eight-ounce glasses of water every day unless you are told not to by your health care provider. If you choose to breastfeed your baby, you may need to drink more water than this.  Eat high-fiber foods every day. These foods may help prevent or relieve constipation. High-fiber foods include: ? Whole grain cereals and breads. ? Brown rice. ? Beans. ? Fresh fruits and vegetables. Activity  Return to your normal activities as told by your health care provider. Ask your health care provider what activities are safe for you.  Rest as much as possible. Try to rest or take a nap when your baby is sleeping.  Do not lift anything that is heavier than your baby or 10 lb (4.5 kg) until your health care  provider says that it is safe.  Talk with your health care provider about when you can engage in sexual activity. This may depend on your: ? Risk of infection. ? Rate of healing. ? Comfort and desire to engage in sexual activity. Vaginal Care  If you have an episiotomy or a vaginal tear, check the area every day for signs of infection. Check for: ? More redness, swelling, or pain. ? More fluid or blood. ? Warmth. ? Pus or a bad smell.  Do not use tampons or douches until your health care provider says this is safe.  Watch for any blood clots that may pass from your vagina. These may look like clumps of dark red, brown, or black discharge. General instructions  Keep your perineum clean and dry as told by your health care provider.  Wear loose, comfortable clothing.  Wipe from front to back when you use the toilet.  Ask your health care provider if you can shower or take a bath. If you had an episiotomy or a perineal tear during labor and delivery, your health care provider may tell you not to take baths for a certain length of time.  Wear a bra that supports your breasts and fits you well.  If possible, have someone help you with household activities and help care for your baby for at least a few days after you leave the hospital.  Keep all follow-up  visits for you and your baby as told by your health care provider. This is important. Contact a health care provider if:  You have: ? Vaginal discharge that has a bad smell. ? Difficulty urinating. ? Pain when urinating. ? A sudden increase or decrease in the frequency of your bowel movements. ? More redness, swelling, or pain around your episiotomy or vaginal tear. ? More fluid or blood coming from your episiotomy or vaginal tear. ? Pus or a bad smell coming from your episiotomy or vaginal tear. ? A fever. ? A rash. ? Little or no interest in activities you used to enjoy. ? Questions about caring for yourself or your  baby.  Your episiotomy or vaginal tear feels warm to the touch.  Your episiotomy or vaginal tear is separating or does not appear to be healing.  Your breasts are painful, hard, or turn red.  You feel unusually sad or worried.  You feel nauseous or you vomit.  You pass large blood clots from your vagina. If you pass a blood clot from your vagina, save it to show to your health care provider. Do not flush blood clots down the toilet without having your health care provider look at them.  You urinate more than usual.  You are dizzy or light-headed.  You have not breastfed at all and you have not had a menstrual period for 12 weeks after delivery.  You have stopped breastfeeding and you have not had a menstrual period for 12 weeks after you stopped breastfeeding. Get help right away if:  You have: ? Pain that does not go away or does not get better with medicine. ? Chest pain. ? Difficulty breathing. ? Blurred vision or spots in your vision. ? Thoughts about hurting yourself or your baby.  You develop pain in your abdomen or in one of your legs.  You develop a severe headache.  You faint.  You bleed from your vagina so much that you fill two sanitary pads in one hour. This information is not intended to replace advice given to you by your health care provider. Make sure you discuss any questions you have with your health care provider. Document Released: 10/18/2000 Document Revised: 04/03/2016 Document Reviewed: 11/05/2015 Elsevier Interactive Patient Education  2019 [employer]

## 2019-04-16 ENCOUNTER — Encounter: Payer: Self-pay | Admitting: *Deleted

## 2019-04-19 ENCOUNTER — Ambulatory Visit: Payer: Medicaid Other | Admitting: Advanced Practice Midwife

## 2019-05-06 ENCOUNTER — Telehealth: Payer: Self-pay | Admitting: Obstetrics & Gynecology

## 2019-05-06 NOTE — Telephone Encounter (Signed)
Patient called, patient just started her first cycle since delivery.  She wants to know when she can schedule for a depo.  [phone]

## 2019-05-18 NOTE — Telephone Encounter (Signed)
Spoke w/pt, scheduled her with Dr. Glo Herring on 06/04/19 at 10:30am.

## 2019-06-04 ENCOUNTER — Other Ambulatory Visit: Payer: Medicaid Other | Admitting: Obstetrics and Gynecology

## 2019-06-14 ENCOUNTER — Ambulatory Visit (INDEPENDENT_AMBULATORY_CARE_PROVIDER_SITE_OTHER): Payer: Medicaid Other | Admitting: Obstetrics and Gynecology

## 2019-06-14 ENCOUNTER — Encounter: Payer: Self-pay | Admitting: Obstetrics and Gynecology

## 2019-06-14 ENCOUNTER — Other Ambulatory Visit: Payer: Self-pay

## 2019-06-14 ENCOUNTER — Ambulatory Visit (INDEPENDENT_AMBULATORY_CARE_PROVIDER_SITE_OTHER): Payer: Medicaid Other | Admitting: *Deleted

## 2019-06-14 VITALS — BP 131/80 | HR 64 | Ht 65.0 in | Wt 142.4 lb

## 2019-06-14 DIAGNOSIS — Z3042 Encounter for surveillance of injectable contraceptive: Secondary | ICD-10-CM | POA: Diagnosis not present

## 2019-06-14 DIAGNOSIS — Z30013 Encounter for initial prescription of injectable contraceptive: Secondary | ICD-10-CM | POA: Diagnosis not present

## 2019-06-14 DIAGNOSIS — Z3202 Encounter for pregnancy test, result negative: Secondary | ICD-10-CM | POA: Diagnosis not present

## 2019-06-14 DIAGNOSIS — N760 Acute vaginitis: Secondary | ICD-10-CM | POA: Diagnosis not present

## 2019-06-14 DIAGNOSIS — B9689 Other specified bacterial agents as the cause of diseases classified elsewhere: Secondary | ICD-10-CM | POA: Diagnosis not present

## 2019-06-14 LAB — POCT URINE PREGNANCY: Preg Test, Ur: NEGATIVE

## 2019-06-14 LAB — POCT WET PREP WITH KOH
Clue Cells Wet Prep HPF POC: NEGATIVE
KOH Prep POC: NEGATIVE
Trichomonas, UA: NEGATIVE
Yeast Wet Prep HPF POC: NEGATIVE

## 2019-06-14 MED ORDER — METRONIDAZOLE 0.75 % VA GEL: 1.0000 | Freq: Every day | VAGINAL | 1 refills | Status: DC

## 2019-06-14 MED ORDER — MEDROXYPROGESTERONE ACETATE 150 MG/ML IM SUSP
150.0000 mg | Freq: Once | INTRAMUSCULAR | Status: AC
  Administered 2019-06-14: 16:00:00 150 mg via INTRAMUSCULAR

## 2019-06-14 MED ORDER — MEDROXYPROGESTERONE ACETATE 150 MG/ML IM SUSP: 150.0000 mg | INTRAMUSCULAR | 3 refills | Status: DC

## 2019-06-14 NOTE — Progress Notes (Signed)
Depo Provera 150mg IM given in left deltoid with no complications. Pt to return in 12 weeks for next injection.  

## 2019-06-14 NOTE — Progress Notes (Signed)
Patient ID: [name], female   DOB: 1996-03-29, 23 y.o.   MRN: [id number]    [facility] Visit  @DATE @            Patient name: Virginia Mills MRN [id number]  Date of birth: 1996/04/29  CC & HPI:  Virginia Mills is a 23 y.o. female presenting today for vaginal discharge. Denies any itching, positive odor to discharge. Wakes up in the morning with lower abdominal tenderness. Recently delivered in May, baby is doing well, is bottle feeding. Is still taking Norvasc has BP cuff at home. Sister was on Depo so is familiar with the medication, sister would at times have light periods. Sister had gained some weight but not much. Doesn't want Nexplanon due to friend having allergic reaction to it. Is nervous about IUD due to cousin having issues with it. Has never been on [facility] Finished her last period 06/09/2019   ROS:  ROS +vaginal discharge -fever -chills  All systems are negative except as noted in the HPI and PMH.    Pertinent History Reviewed:   Reviewed:  Medical         Past Medical History:  Diagnosis Date  . Bell's palsy   . HSV-2 infection                               Surgical Hx:    Past Surgical History:  Procedure Laterality Date  . INDUCED ABORTION    . INDUCED ABORTION     Medications: Reviewed & Updated - see associated section                       Current Outpatient Medications:  .  amLODipine (NORVASC) 10 MG tablet, Take 1 tablet (10 mg total) by mouth daily., Disp: 30 tablet, Rfl: 0 .  aspirin-acetaminophen-caffeine (EXCEDRIN MIGRAINE) 250-250-65 MG tablet, Take by mouth as needed for headache., Disp: , Rfl:  .  Blood Pressure Monitoring (BLOOD PRESSURE MONITOR AUTOMAT) DEVI, Take BP at home daily.  Alert [place] if >140/90 more than once., Disp: 1 Device, Rfl: 0 .  diphenhydrAMINE-APAP, sleep, (TYLENOL PM EXTRA STRENGTH PO), Take 2 tablets by mouth as needed. , Disp: , Rfl:  .  ibuprofen (ADVIL) 600 MG tablet, Take 1 tablet (600 mg total) by mouth  every 6 (six) hours. (Patient taking differently: Take 600 mg by mouth as needed. ), Disp: 30 tablet, Rfl: 0   Social History: Reviewed -  reports that she has been smoking cigarettes. She has been smoking about 0.00 packs per day. She has never used smokeless tobacco.  Objective Findings:  Vitals: Blood pressure 131/80, pulse 64, height 5\' 5"  (1.651 m), weight 142 lb 6.4 oz (64.6 kg), last menstrual period 06/08/2019, not currently breastfeeding.  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  Medication Management contraception 2. BV by pt hx. 3. Condyloma, inguinal  P:  1.  Rx Depo 2. metrogel per pt preference 3. PRN for excision of condyloma.    By signing my name below, I, Arnette NorrisMari Johnson, attest that this documentation has been prepared under the direction and in the presence of Tilda BurrowFerguson, Mykal Kirchman V, MD. Electronically Signed: Arnette NorrisMari Johnson Medical Scribe. 06/14/19. 11:30 AM.  I personally performed the services described in this documentation, which was SCRIBED in my presence. The recorded information has been reviewed and considered accurate. It has been edited as necessary during review. Tilda BurrowJohn V Suzette Flagler, MD

## 2019-06-14 NOTE — Addendum Note (Signed)
Addended by: Linton Rump on: 06/14/2019 12:23 PM   Modules accepted: Orders

## 2019-07-01 ENCOUNTER — Ambulatory Visit: Payer: Medicaid Other | Admitting: Obstetrics & Gynecology

## 2019-08-10 ENCOUNTER — Other Ambulatory Visit: Payer: Self-pay | Admitting: Obstetrics and Gynecology

## 2019-08-11 NOTE — Telephone Encounter (Signed)
Pt is convinced of recurrent BV though negative at time of last visit. Will try weekly tx x 1 to see if there is an increase in pt satisfaction. If not, may require NUSWAB to clarify dx.

## 2019-09-06 ENCOUNTER — Ambulatory Visit: Payer: Medicaid Other

## 2019-09-20 ENCOUNTER — Other Ambulatory Visit: Payer: Medicaid Other | Admitting: Adult Health

## 2019-10-05 ENCOUNTER — Encounter: Payer: Self-pay | Admitting: *Deleted

## 2019-10-07 ENCOUNTER — Telehealth (INDEPENDENT_AMBULATORY_CARE_PROVIDER_SITE_OTHER): Payer: Medicaid Other | Admitting: Advanced Practice Midwife

## 2019-10-07 ENCOUNTER — Other Ambulatory Visit: Payer: Self-pay

## 2019-10-07 DIAGNOSIS — Z3009 Encounter for other general counseling and advice on contraception: Secondary | ICD-10-CM

## 2019-10-07 NOTE — Patient Instructions (Signed)
Etonogestrel implant What is this medicine? ETONOGESTREL (et oh noe JES trel) is a contraceptive (birth control) device. It is used to prevent pregnancy. It can be used for up to 3 years. This medicine may be used for other purposes; ask your health care provider or pharmacist if you have questions. COMMON BRAND NAME(S): Implanon, Nexplanon What should I tell my health care provider before I take this medicine? They need to know if you have any of these conditions:  abnormal vaginal bleeding  blood vessel disease or blood clots  breast, cervical, endometrial, ovarian, liver, or uterine cancer  diabetes  gallbladder disease  heart disease or recent heart attack  high blood pressure  high cholesterol or triglycerides  kidney disease  liver disease  migraine headaches  seizures  stroke  tobacco smoker  an unusual or allergic reaction to etonogestrel, anesthetics or antiseptics, other medicines, foods, dyes, or preservatives  pregnant or trying to get pregnant  breast-feeding How should I use this medicine? This device is inserted just under the skin on the inner side of your upper arm by a health care professional. Talk to your pediatrician regarding the use of this medicine in children. Special care may be needed. Overdosage: If you think you have taken too much of this medicine contact a poison control center or emergency room at once. NOTE: This medicine is only for you. Do not share this medicine with others. What if I miss a dose? This does not apply. What may interact with this medicine? Do not take this medicine with any of the following medications:  amprenavir  fosamprenavir This medicine may also interact with the following medications:  acitretin  aprepitant  armodafinil  bexarotene  bosentan  carbamazepine  certain medicines for fungal infections like fluconazole, ketoconazole, itraconazole and voriconazole  certain medicines to treat  hepatitis, HIV or AIDS  cyclosporine  felbamate  griseofulvin  lamotrigine  modafinil  oxcarbazepine  phenobarbital  phenytoin  primidone  rifabutin  rifampin  rifapentine  St. John's wort  topiramate This list may not describe all possible interactions. Give your health care provider a list of all the medicines, herbs, non-prescription drugs, or dietary supplements you use. Also tell them if you smoke, drink alcohol, or use illegal drugs. Some items may interact with your medicine. What should I watch for while using this medicine? This product does not protect you against HIV infection (AIDS) or other sexually transmitted diseases. You should be able to feel the implant by pressing your fingertips over the skin where it was inserted. Contact your doctor if you cannot feel the implant, and use a non-hormonal birth control method (such as condoms) until your doctor confirms that the implant is in place. Contact your doctor if you think that the implant may have broken or become bent while in your arm. You will receive a user card from your health care provider after the implant is inserted. The card is a record of the location of the implant in your upper arm and when it should be removed. Keep this card with your health records. What side effects may I notice from receiving this medicine? Side effects that you should report to your doctor or health care professional as soon as possible:  allergic reactions like skin rash, itching or hives, swelling of the face, lips, or tongue  breast lumps, breast tissue changes, or discharge  breathing problems  changes in emotions or moods  if you feel that the implant may have broken or   bent while in your arm  high blood pressure  pain, irritation, swelling, or bruising at the insertion site  scar at site of insertion  signs of infection at the insertion site such as fever, and skin redness, pain or discharge  signs and  symptoms of a blood clot such as breathing problems; changes in vision; chest pain; severe, sudden headache; pain, swelling, warmth in the leg; trouble speaking; sudden numbness or weakness of the face, arm or leg  signs and symptoms of liver injury like dark yellow or brown urine; general ill feeling or flu-like symptoms; light-colored stools; loss of appetite; nausea; right upper belly pain; unusually weak or tired; yellowing of the eyes or skin  unusual vaginal bleeding, discharge Side effects that usually do not require medical attention (report to your doctor or health care professional if they continue or are bothersome):  acne  breast pain or tenderness  headache  irregular menstrual bleeding  nausea This list may not describe all possible side effects. Call your doctor for medical advice about side effects. You may report side effects to FDA at 1-800-FDA-1088. Where should I keep my medicine? This drug is given in a hospital or clinic and will not be stored at home. NOTE: This sheet is a summary. It may not cover all possible information. If you have questions about this medicine, talk to your doctor, pharmacist, or health care provider.  2020 Elsevier/Gold Standard (2017-09-09 14:11:42)  

## 2019-10-07 NOTE — Progress Notes (Signed)
   TELEHEALTH VIRTUAL GYNECOLOGY VISIT ENCOUNTER NOTE  I connected with [name] on 10/07/19 at  8:50 AM EST by telephone at home and verified that I am speaking with the correct person using two identifiers.   I discussed the limitations, risks, security and privacy concerns of performing an evaluation and management service by telephone and the availability of in person appointments. I also discussed with the patient that there may be a patient responsible charge related to this service. The patient expressed understanding and agreed to proceed.   History:  Virginia Mills is a 23 y.o. [phone] female being evaluated today for birth control change.  She had her first depo 4 months ago, never bled at all, but it decreased her appetite and she has lost 12#.  She had a bleeding episode (hat lasted 10 days) 12 days ago.  She has not had sex since before that. .Interested in the [place]. Discussed risks/benefits/SE.  Hopeful that since she did so well w/bleeding on Depo that she will react similarly; discussed that it may or may not affect appetite.  She denies any abnormal vaginal discharge, bleeding, pelvic pain or other concerns.       Past Medical History:  Diagnosis Date  . Bell's palsy   . HSV-2 infection    Past Surgical History:  Procedure Laterality Date  . INDUCED ABORTION    . INDUCED ABORTION     The following portions of the patient's history were reviewed and updated as appropriate: allergies, current medications, past family history, past medical history, past social history, past surgical history and problem list.   Health Maintenance:  Normal pap and negative HRHPV on 09/17/18.   Review of Systems:  Pertinent items noted in HPI and remainder of comprehensive ROS otherwise negative.  Physical Exam:   General:  Alert, oriented and cooperative.   Mental Status: Normal mood and affect perceived. Normal judgment and thought content.  Physical exam deferred due to  nature of the encounter  Labs and Imaging No results found for this or any previous visit (from the past 336 hour(s)). No results found.    Assessment and Plan:         Contraception mgt--Nexplanon next week, no sex until then   I discussed the assessment and treatment plan with the patient. The patient was provided an opportunity to ask questions and all were answered. The patient agreed with the plan and demonstrated an understanding of the instructions.   The patient was advised to call back or seek an in-person evaluation/go to the ED if the symptoms worsen or if the condition fails to improve as anticipated.  I provided 10 minutes of non-face-to-face time during this encounter.   Christin Fudge, [place] for [employer], [place]

## 2019-10-11 ENCOUNTER — Encounter: Payer: Self-pay | Admitting: Adult Health

## 2019-10-11 ENCOUNTER — Ambulatory Visit (INDEPENDENT_AMBULATORY_CARE_PROVIDER_SITE_OTHER): Payer: Medicaid Other | Admitting: Adult Health

## 2019-10-11 ENCOUNTER — Other Ambulatory Visit: Payer: Self-pay

## 2019-10-11 VITALS — BP 121/83 | HR 70 | Ht 65.0 in | Wt 136.0 lb

## 2019-10-11 DIAGNOSIS — Z3202 Encounter for pregnancy test, result negative: Secondary | ICD-10-CM

## 2019-10-11 DIAGNOSIS — Z30017 Encounter for initial prescription of implantable subdermal contraceptive: Secondary | ICD-10-CM

## 2019-10-11 LAB — POCT URINE PREGNANCY: Preg Test, Ur: NEGATIVE

## 2019-10-11 MED ORDER — METRONIDAZOLE 0.75 % VA GEL: 1.0000 | Freq: Every day | VAGINAL | 0 refills | Status: DC

## 2019-10-11 MED ORDER — ETONOGESTREL 68 MG ~~LOC~~ IMPL
68.0000 mg | DRUG_IMPLANT | Freq: Once | SUBCUTANEOUS | Status: AC
  Administered 2019-10-11: 11:00:00 68 mg via SUBCUTANEOUS

## 2019-10-11 NOTE — Addendum Note (Signed)
Addended by: Linton Rump on: 10/11/2019 11:11 AM   Modules accepted: Orders

## 2019-10-11 NOTE — Progress Notes (Signed)
  Subjective:     Patient ID: Virginia Mills, female   DOB: September 16, 1996, 23 y.o.   MRN: [id number]  HPI Virginia Mills is a 23 year old black female, single, [id number] in for nexplanon insertion.   Review of Systems For nexplanon insertion Has vaginal odor at times, and uses metrogel  Reviewed past medical,surgical, social and family history. Reviewed medications and allergies.     Objective:   Physical Exam BP 121/83 (BP Location: Left Arm, Patient Position: Sitting, Cuff Size: Normal)   Pulse 70   Ht 5\' 5"  (1.651 m)   Wt 136 lb (61.7 kg)   LMP 09/24/2019   Breastfeeding No   BMI 22.63 kg/m   UPT negative, no sex in over 2 weeks  Consent signed, time out called. Left arm cleansed with betadine, and injected with 1.5 cc 1% lidocaine and waited til numb. Nexplanon easily inserted and steri strips applied.Rod easily palpated by provider and pt. Pressure dressing applied.   Refilled metrogel at her request for vaginal odor at times.  Assessment:     1. Nexplanon insertion   2. Pregnancy examination or test, negative result      Lot # [id number]  Exp [id number]  Plan:    Use condoms x 2 weeks, keep clean and dry x 24 hours, no heavy lifting, keep steri strips on x 72 hours, Keep pressure dressing on x 24 hours. Follow up prn problems. Meds ordered this encounter  Medications  . metroNIDAZOLE (METROGEL VAGINAL) 0.75 % vaginal gel    Sig: Place 1 Applicatorful vaginally at bedtime.    Dispense:  70 g    Refill:  0    Order Specific Question:   Supervising Provider    Answer:   Tania Ade H [[id number]]

## 2019-10-11 NOTE — Patient Instructions (Signed)
Use condoms x 2 weeks, keep clean and dry x 24 hours, no heavy lifting, keep steri strips on x 72 hours, Keep pressure dressing on x 24 hours. Follow up prn problems.  

## 2019-10-14 ENCOUNTER — Encounter: Payer: Medicaid Other | Admitting: Advanced Practice Midwife

## 2019-11-30 IMAGING — US US OB TRANSVAGINAL
1 series · 14 of 28 positions shown · non-contrast
Comparison: 03/17/2014

CLINICAL DATA: By LMP of 06/20/2018 the patient is 6 weeks 2 days.
EDC by LMP is 03/27/2019. Size versus dates. Assess dating.

EXAM:
OBSTETRIC <14 WK US AND TRANSVAGINAL OB US
TECHNIQUE: Both transabdominal and transvaginal ultrasound examinations were
performed for complete evaluation of the gestation as well as the
maternal uterus, adnexal regions, and pelvic cul-de-sac.
Transvaginal technique was performed to assess early pregnancy.

[Series 1: us ob transvaginal · 0.17mm/px · 14 of 123 slices shown]
[im 5/123]
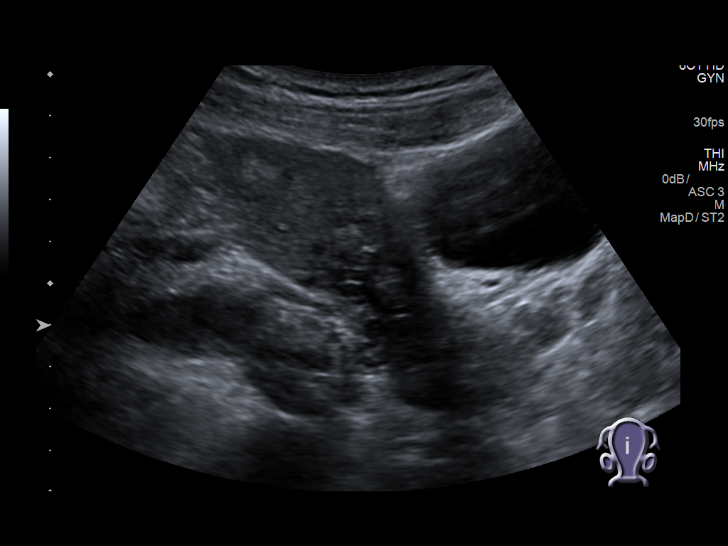
[im 14/123]
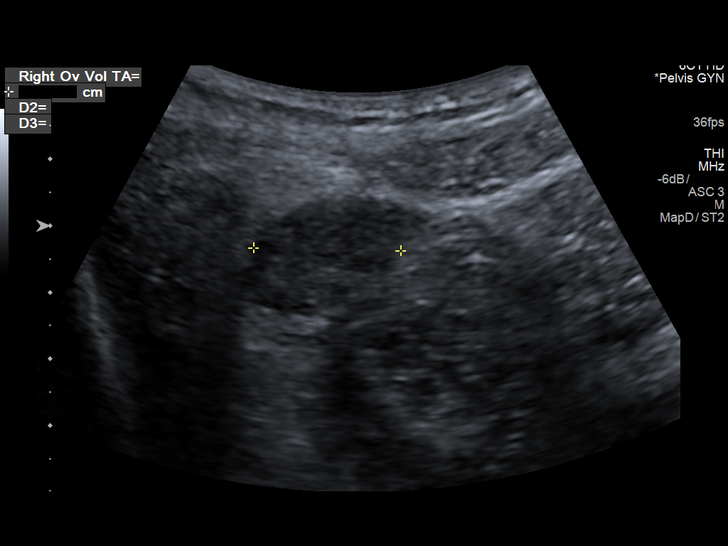
[im 23/123]
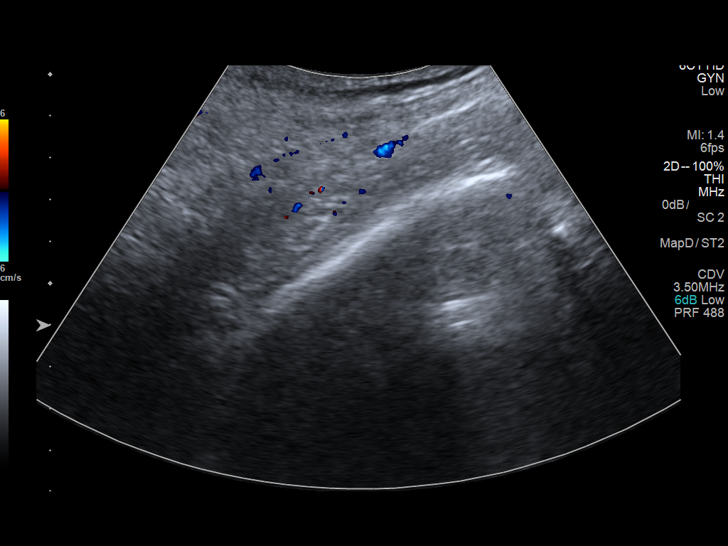
[im 32/123]
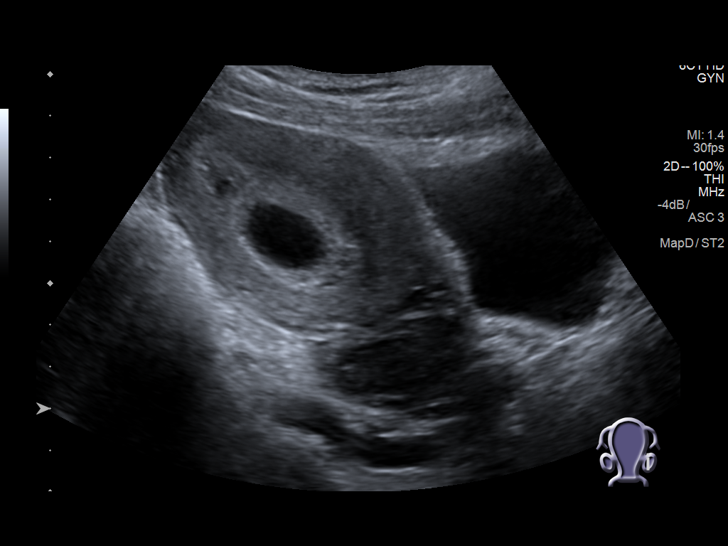
[im 41/123]
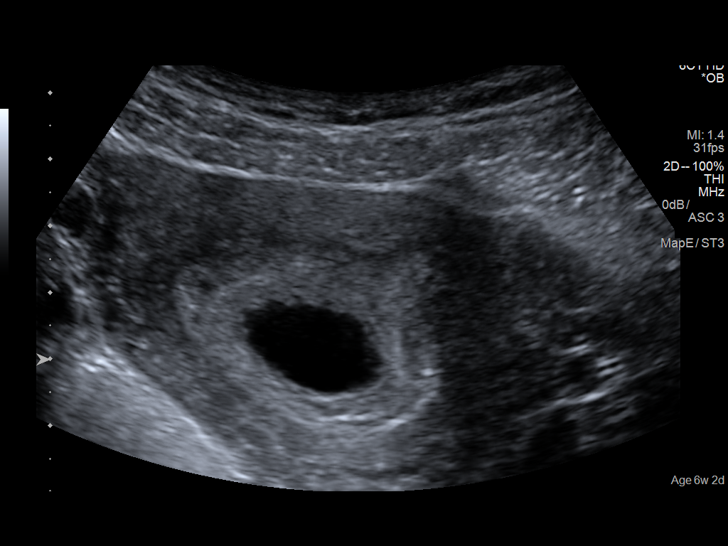
[im 50/123]
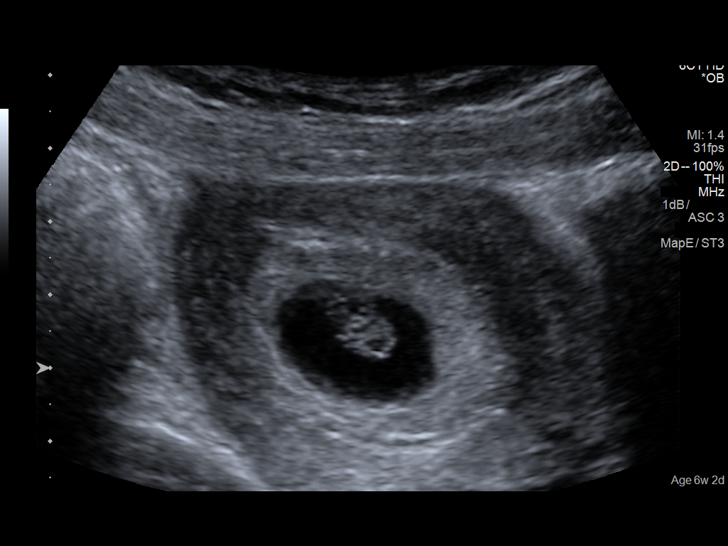
[im 59/123]
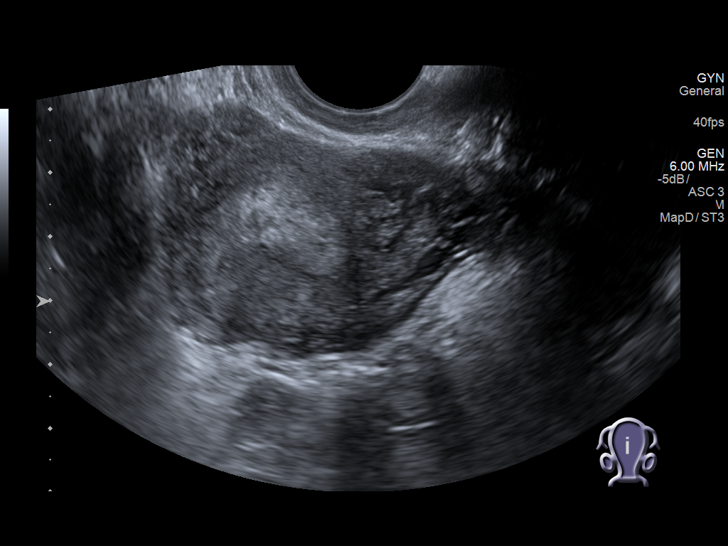
[im 68/123]
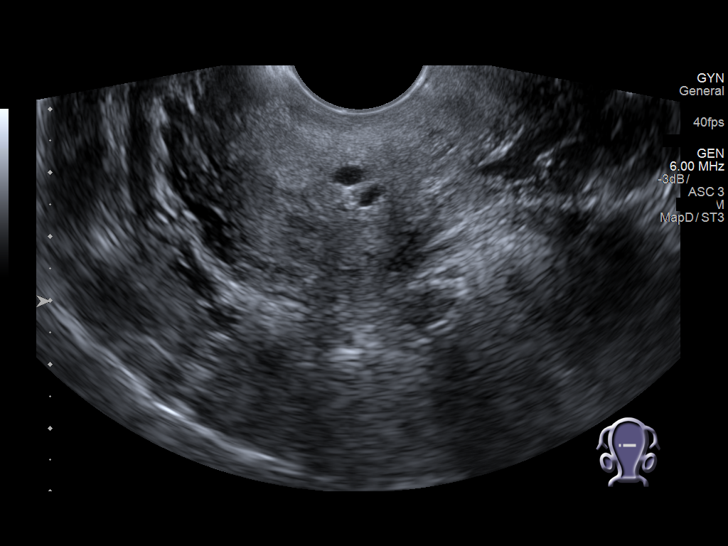
[im 77/123]
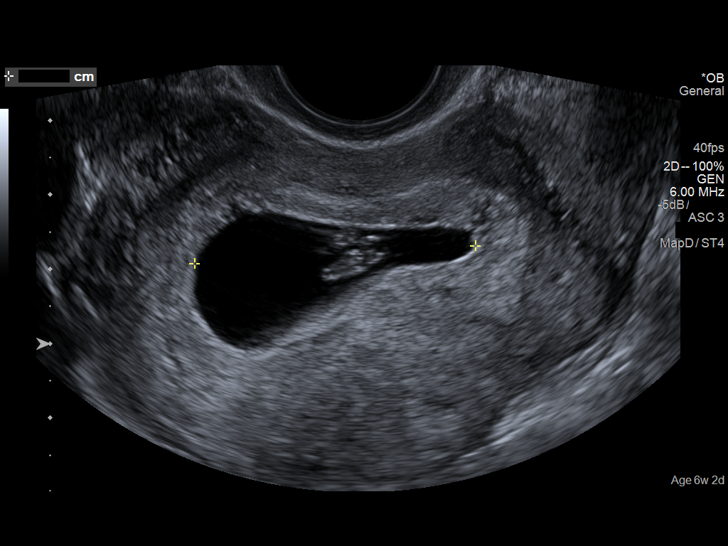
[im 86/123]
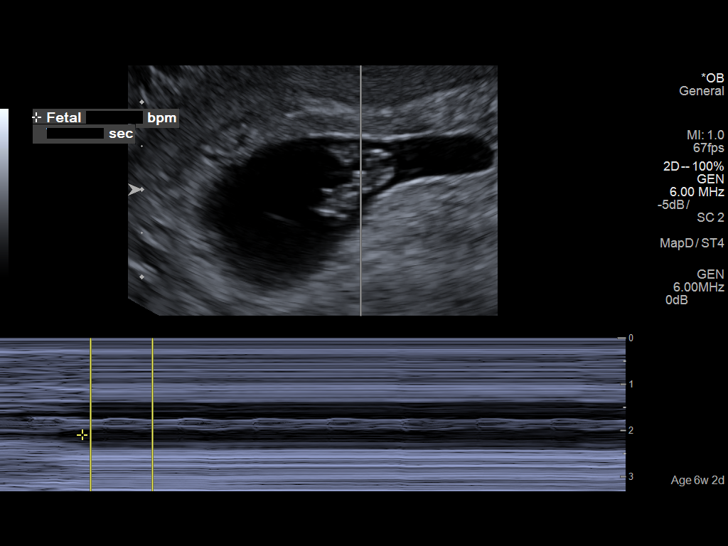
[im 95/123]
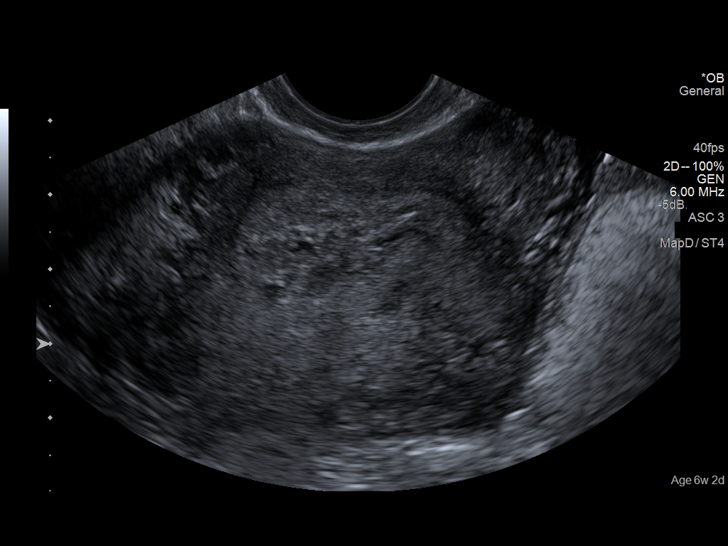
[im 104/123]
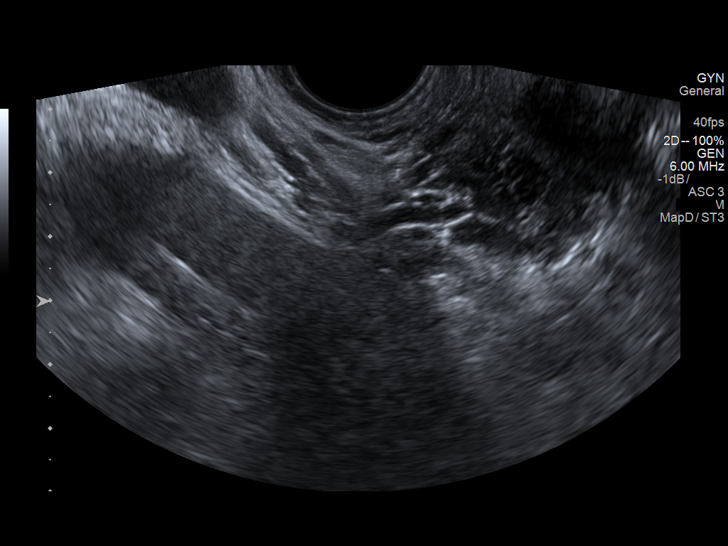
[im 113/123]
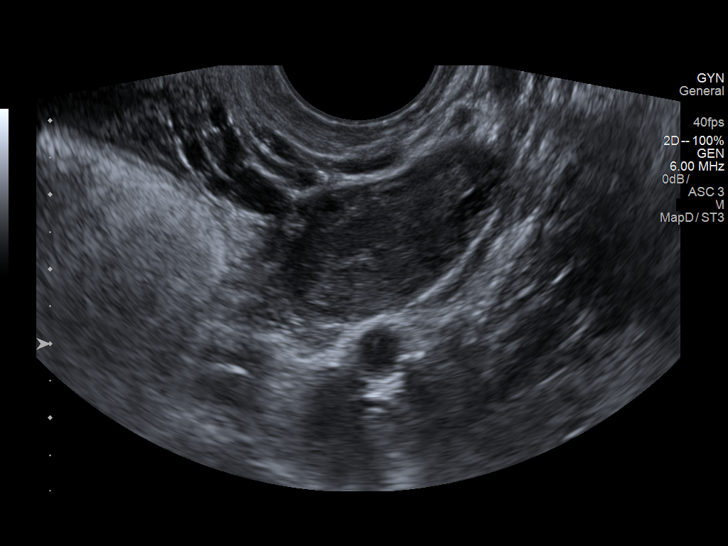
[im 123/123]
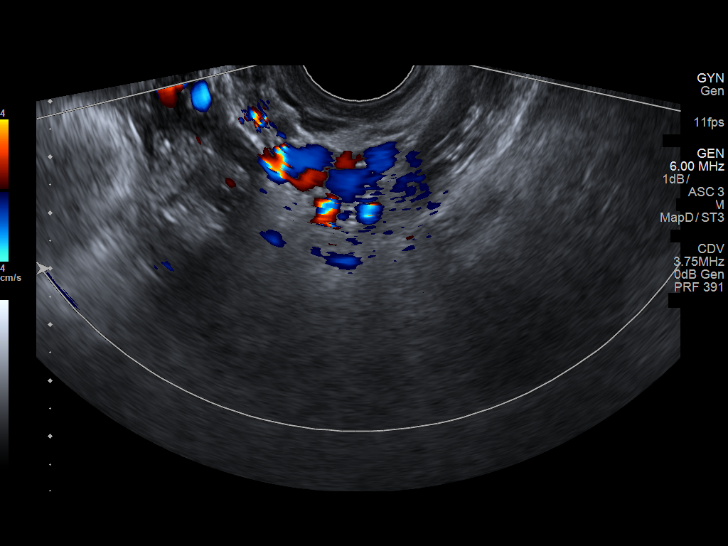

[14 of 28 positions shown; findings below may reference images not displayed]

FINDINGS: Intrauterine gestational sac: Single

Yolk sac:  Visualized.

Embryo:  Visualized.

Cardiac Activity: Visualized.

Heart Rate: 165 bpm

CRL:  10.2 mm   7 w   1 d                  US EDC: 03/21/2019

Subchorionic hemorrhage:  None visualized.

Maternal uterus/adnexae: The ovaries are normal in appearance. LEFT
corpus luteum cyst measures 2.0 centimeters. No free pelvic fluid.
IMPRESSION: 1. Single living intrauterine embryo measuring 7 weeks 1 day and
differing from clinical dating.
2. By today's exam EDC is 03/21/2019.

## 2019-12-30 ENCOUNTER — Ambulatory Visit: Payer: Medicaid Other

## 2020-04-05 ENCOUNTER — Telehealth: Payer: Self-pay | Admitting: Obstetrics and Gynecology

## 2020-04-05 NOTE — Telephone Encounter (Signed)
Spoke with Dr. Emelda Fear. He requires pt to have a televisit before ordering tests. Pt was transferred to [facility] for appt. JSY

## 2020-04-05 NOTE — Telephone Encounter (Signed)
Pt wants labwork done, EKG, and chest xray done would like to know if Dr. Emelda Fear can put these orders in. States its medical clearance for an upcoming sx.

## 2020-04-14 ENCOUNTER — Other Ambulatory Visit: Payer: Self-pay

## 2020-04-14 ENCOUNTER — Other Ambulatory Visit (HOSPITAL_COMMUNITY): Payer: Self-pay | Admitting: Plastic Surgery

## 2020-04-14 ENCOUNTER — Ambulatory Visit (HOSPITAL_COMMUNITY)
Admission: RE | Admit: 2020-04-14 | Discharge: 2020-04-14 | Disposition: A | Discharge status: Home / Self Care | Payer: Medicaid Other | Source: Ambulatory Visit | Attending: Plastic Surgery | Admitting: Plastic Surgery

## 2020-04-14 DIAGNOSIS — Z01818 Encounter for other preprocedural examination: Secondary | ICD-10-CM | POA: Insufficient documentation

## 2020-04-17 ENCOUNTER — Telehealth: Payer: Medicaid Other | Admitting: Obstetrics and Gynecology

## 2020-05-15 ENCOUNTER — Other Ambulatory Visit (HOSPITAL_COMMUNITY)
Admission: RE | Admit: 2020-05-15 | Discharge: 2020-05-15 | Disposition: A | Discharge status: Home / Self Care | Payer: Medicaid Other | Source: Ambulatory Visit | Attending: Obstetrics & Gynecology | Admitting: Obstetrics & Gynecology

## 2020-05-15 ENCOUNTER — Encounter: Payer: Self-pay | Admitting: Obstetrics & Gynecology

## 2020-05-15 ENCOUNTER — Ambulatory Visit (INDEPENDENT_AMBULATORY_CARE_PROVIDER_SITE_OTHER): Payer: Medicaid Other | Admitting: Obstetrics & Gynecology

## 2020-05-15 ENCOUNTER — Telehealth: Payer: Self-pay | Admitting: Obstetrics & Gynecology

## 2020-05-15 VITALS — BP 123/80 | HR 69 | Ht 65.0 in | Wt 135.4 lb

## 2020-05-15 DIAGNOSIS — Z01818 Encounter for other preprocedural examination: Secondary | ICD-10-CM

## 2020-05-15 DIAGNOSIS — Z01419 Encounter for gynecological examination (general) (routine) without abnormal findings: Secondary | ICD-10-CM | POA: Insufficient documentation

## 2020-05-15 NOTE — Progress Notes (Signed)
Patient ID: Virginia Mills, female   DOB: February 09, 1996, 24 y.o.   MRN: [id number] Subjective:     Virginia Mills is a 24 y.o. female here for a routine exam.  Patient's last menstrual period was 04/16/2020. [id number] Birth Control Method:  Nexplanon Menstrual Calendar(currently): regular  Current complaints: none.   Current acute medical issues:  none   Recent Gynecologic History Patient's last menstrual period was 04/16/2020. Last Pap: 2019,  normal Last mammogram: ,    Past Medical History:  Diagnosis Date   Bell's palsy    HSV-2 infection     Past Surgical History:  Procedure Laterality Date   INDUCED ABORTION     INDUCED ABORTION      OB History    Gravida  4   Para  2   Term  2   Preterm      AB  2   Living  2     SAB      TAB  2   Ectopic      Multiple      Live Births  2           Social History   Socioeconomic History   Marital status: Single    Spouse name: Not on file   Number of children: 2   Years of education: 10    Highest education level: 10th grade  Occupational History   Not on file  Tobacco Use   Smoking status: Current Every Day Smoker    Packs/day: 0.00    Types: Cigarettes   Smokeless tobacco: Never Used   Tobacco comment: smokes 7 cig daily  Vaping Use   Vaping Use: Never used  Substance and Sexual Activity   Alcohol use: Yes   Drug use: No    Types: Marijuana    Comment: no longer uses   Sexual activity: Yes    Birth control/protection: None, Implant  Other Topics Concern   Not on file  Social History Narrative   Not on file   Social Determinants of Health   Financial Resource Strain: Medium Risk   Difficulty of Paying Living Expenses: Somewhat hard  Food Insecurity: No Food Insecurity   Worried About Programme researcher, broadcasting/film/video in the Last Year: Never true   Ran Out of Food in the Last Year: Never true  Transportation Needs:    Lack of Transportation (Medical):    Lack of  Transportation (Non-Medical):   Physical Activity: Sufficiently Active   Days of Exercise per Week: 2 days   Minutes of Exercise per Session: 100 min  Stress: No Stress Concern Present   Feeling of Stress : Only a little  Social Connections: Moderately Isolated   Frequency of Communication with Friends and Family: More than three times a week   Frequency of Social Gatherings with Friends and Family: Twice a week   Attends Religious Services: Never   Database administrator or Organizations: No   Attends Engineer, structural: Never   Marital Status: Living with partner    Family History  Problem Relation Age of Onset   Diabetes Father    Diabetes Paternal Grandmother    Hypertension Paternal Grandmother    Diabetes Paternal Grandfather    Hypertension Paternal Grandfather    Heart attack Paternal Grandfather    Cancer Maternal Aunt    Autism Cousin      Current Outpatient Medications:    aspirin-acetaminophen-caffeine (EXCEDRIN MIGRAINE) [phone] MG tablet, Take by mouth as  needed for headache., Disp: , Rfl:    diphenhydrAMINE-APAP, sleep, (TYLENOL PM EXTRA STRENGTH PO), Take 2 tablets by mouth as needed. , Disp: , Rfl:    amLODipine (NORVASC) 10 MG tablet, Take 1 tablet (10 mg total) by mouth daily. (Patient not taking: Reported on 05/15/2020), Disp: 30 tablet, Rfl: 0   metroNIDAZOLE (METROGEL VAGINAL) 0.75 % vaginal gel, Place 1 Applicatorful vaginally at bedtime. (Patient not taking: Reported on 05/15/2020), Disp: 70 g, Rfl: 0  Review of Systems  Review of Systems  Constitutional: Negative for fever, chills, weight loss, malaise/fatigue and diaphoresis.  HENT: Negative for hearing loss, ear pain, nosebleeds, congestion, sore throat, neck pain, tinnitus and ear discharge.   Eyes: Negative for blurred vision, double vision, photophobia, pain, discharge and redness.  Respiratory: Negative for cough, hemoptysis, sputum production, shortness of  breath, wheezing and stridor.   Cardiovascular: Negative for chest pain, palpitations, orthopnea, claudication, leg swelling and PND.  Gastrointestinal: negative for abdominal pain. Negative for heartburn, nausea, vomiting, diarrhea, constipation, blood in stool and melena.  Genitourinary: Negative for dysuria, urgency, frequency, hematuria and flank pain.  Musculoskeletal: Negative for myalgias, back pain, joint pain and falls.  Skin: Negative for itching and rash.  Neurological: Negative for dizziness, tingling, tremors, sensory change, speech change, focal weakness, seizures, loss of consciousness, weakness and headaches.  Endo/Heme/Allergies: Negative for environmental allergies and polydipsia. Does not bruise/bleed easily.  Psychiatric/Behavioral: Negative for depression, suicidal ideas, hallucinations, memory loss and substance abuse. The patient is not nervous/anxious and does not have insomnia.        Objective:  Blood pressure 123/80, pulse 69, height 5\' 5"  (1.651 m), weight 135 lb 6.4 oz (61.4 kg), last menstrual period 04/16/2020, not currently breastfeeding.   Physical Exam  Vitals reviewed. Constitutional: She is oriented to person, place, and time. She appears well-developed and well-nourished.  HENT:  Head: Normocephalic and atraumatic.        Right Ear: External ear normal.  Left Ear: External ear normal.  Nose: Nose normal.  Mouth/Throat: Oropharynx is clear and moist.  Eyes: Conjunctivae and EOM are normal. Pupils are equal, round, and reactive to light. Right eye exhibits no discharge. Left eye exhibits no discharge. No scleral icterus.  Neck: Normal range of motion. Neck supple. No tracheal deviation present. No thyromegaly present.  Cardiovascular: Normal rate, regular rhythm, normal heart sounds and intact distal pulses.  Exam reveals no gallop and no friction rub.   No murmur heard. Respiratory: Effort normal and breath sounds normal. No respiratory distress. She has  no wheezes. She has no rales. She exhibits no tenderness.  GI: Soft. Bowel sounds are normal. She exhibits no distension and no mass. There is no tenderness. There is no rebound and no guarding.  Genitourinary:  Breasts no masses skin changes or nipple changes bilaterally      Vulva is normal without lesions Vagina is pink moist without discharge Cervix normal in appearance and pap is done Uterus is normal size shape and contour Adnexa is negative with normal sized ovaries   Musculoskeletal: Normal range of motion. She exhibits no edema and no tenderness.  Neurological: She is alert and oriented to person, place, and time. She has normal reflexes. She displays normal reflexes. No cranial nerve deficit. She exhibits normal muscle tone. Coordination normal.  Skin: Skin is warm and dry. No rash noted. No erythema. No pallor.  Psychiatric: She has a normal mood and affect. Her behavior is normal. Judgment and thought content normal.  Medications Ordered at today's visit: No orders of the defined types were placed in this encounter.   Other orders placed at today's visit: Orders Placed This Encounter  Procedures   EKG 12-Lead      Assessment:    Normal Gyn exam.   Normal health screen Plan:    Contraception: Nexplanon. Follow up in: 3 years.     No follow-ups on file.

## 2020-05-15 NOTE — Telephone Encounter (Signed)
sent 

## 2020-05-15 NOTE — Telephone Encounter (Signed)
Patient would like [place] Provider Name:Dr. Raechel Ache at [facility] Fax # [phone]

## 2020-05-15 NOTE — Addendum Note (Signed)
Addended by: Lynnell Dike on: 05/15/2020 11:47 AM   Modules accepted: Orders

## 2020-05-16 ENCOUNTER — Telehealth: Payer: Self-pay | Admitting: Obstetrics & Gynecology

## 2020-05-16 ENCOUNTER — Other Ambulatory Visit: Payer: Self-pay | Admitting: Obstetrics & Gynecology

## 2020-05-16 DIAGNOSIS — Z01818 Encounter for other preprocedural examination: Secondary | ICD-10-CM

## 2020-05-16 NOTE — Telephone Encounter (Signed)
Patient called in about her EKG, she went to the hospital to get it done and they told her the order was put in wrong that the Md put it in for [facility] to [facility] and wants to know can she get the order fixed, and once its fixed if someone can give her a call about it.

## 2020-05-17 LAB — CYTOLOGY - PAP
Chlamydia: NEGATIVE
Comment: NEGATIVE
Comment: NEGATIVE
Comment: NORMAL
Diagnosis: NEGATIVE
High risk HPV: NEGATIVE
Neisseria Gonorrhea: NEGATIVE

## 2020-05-22 ENCOUNTER — Other Ambulatory Visit: Payer: Self-pay

## 2020-05-22 ENCOUNTER — Other Ambulatory Visit (HOSPITAL_COMMUNITY)
Admission: RE | Admit: 2020-05-22 | Discharge: 2020-05-22 | Disposition: A | Discharge status: Home / Self Care | Payer: Medicaid Other | Source: Ambulatory Visit | Attending: Obstetrics & Gynecology | Admitting: Obstetrics & Gynecology

## 2020-05-22 ENCOUNTER — Other Ambulatory Visit: Payer: Self-pay | Admitting: *Deleted

## 2020-05-22 DIAGNOSIS — Z01818 Encounter for other preprocedural examination: Secondary | ICD-10-CM

## 2020-05-22 DIAGNOSIS — Z01810 Encounter for preprocedural cardiovascular examination: Secondary | ICD-10-CM | POA: Insufficient documentation

## 2020-05-22 DIAGNOSIS — I499 Cardiac arrhythmia, unspecified: Secondary | ICD-10-CM | POA: Diagnosis not present

## 2020-05-24 ENCOUNTER — Ambulatory Visit
Admission: EM | Admit: 2020-05-24 | Discharge: 2020-05-24 | Disposition: A | Discharge status: Home / Self Care | Payer: Medicaid Other | Attending: Family Medicine | Admitting: Family Medicine

## 2020-05-24 DIAGNOSIS — Z01818 Encounter for other preprocedural examination: Secondary | ICD-10-CM | POA: Diagnosis not present

## 2020-05-24 NOTE — Discharge Instructions (Addendum)
Normal EKG today  May give [place] to surgeons office  Follow-up as needed with this office or with primary care

## 2020-05-24 NOTE — ED Provider Notes (Signed)
[facility]    CSN: [id number] Arrival date & time: 05/24/20  1837      History   Chief Complaint Chief Complaint  Patient presents with  . needs a EKG    HPI Virginia Mills is a 24 y.o. female.   Presents today for EKG for elective procedure that is occurring later this month.  Patient reports that she is going to have a [place] butt lift in [place] next week.  She has already had the rest of her preop visit completed, states that the medical office of the plastic surgeon is requesting a repeat EKG.  Denies palpitations, no shortness of breath on exertion, no sweating, no chest pain, other symptoms.  ROS per HPI  The history is provided by the patient.    Past Medical History:  Diagnosis Date  . Bell's palsy   . HSV-2 infection     Patient Active Problem List   Diagnosis Date Noted  . Depression 07/30/2018  . 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 Marijuana use 04/24/2013    Past Surgical History:  Procedure Laterality Date  . INDUCED ABORTION    . INDUCED ABORTION      OB History    Gravida  4   Para  2   Term  2   Preterm      AB  2   Living  2     SAB      TAB  2   Ectopic      Multiple      Live Births  2            Home Medications    Prior to Admission medications   Medication Sig Start Date End Date Taking? Authorizing Provider  amLODipine (NORVASC) 10 MG tablet Take 1 tablet (10 mg total) by mouth daily. Patient not taking: Reported on 05/15/2020 03/17/19   Conan Bowens, MD  aspirin-acetaminophen-caffeine [facility] MIGRAINE) [phone] MG tablet Take by mouth as needed for headache.    [provider]  diphenhydrAMINE-APAP, sleep, (TYLENOL PM EXTRA STRENGTH PO) Take 2 tablets by mouth as needed.     [provider]    Family History Family History  Problem Relation Age of Onset  . Diabetes Father   . Diabetes Paternal Grandmother   . Hypertension Paternal Grandmother   . Diabetes Paternal  Grandfather   . Hypertension Paternal Grandfather   . Heart attack Paternal Grandfather   . Cancer Maternal Aunt   . Autism Cousin     Social History Social History   Tobacco Use  . Smoking status: Current Every Day Smoker    Packs/day: 0.00    Types: Cigarettes  . Smokeless tobacco: Never Used  . Tobacco comment: smokes 7 cig daily  Vaping Use  . Vaping Use: Never used  Substance Use Topics  . Alcohol use: Yes  . Drug use: No    Types: Marijuana    Comment: no longer uses     Allergies   Patient has no known allergies.   Review of Systems Review of Systems   Physical Exam Triage Vital Signs ED Triage Vitals  Enc Vitals Group     BP 05/24/20 1846 115/76     Pulse Rate 05/24/20 1846 73     Resp 05/24/20 1858 18     Temp 05/24/20 1846 98.3 F (36.8 C)     Temp Source 05/24/20 1846 Oral     SpO2 05/24/20 1846 98 %  Weight --      Height --      Head Circumference --      Peak Flow --      Pain Score 05/24/20 1858 0     Pain Loc --      Pain Edu? --      Excl. in GC? --    No data found.  Updated Vital Signs BP 115/76 (BP Location: Left Arm)   Pulse 73   Temp 98.3 F (36.8 C) (Oral)   Resp 18   SpO2 98%   Visual Acuity Right Eye Distance:   Left Eye Distance:   Bilateral Distance:    Right Eye Near:   Left Eye Near:    Bilateral Near:     Physical Exam Vitals and nursing note reviewed.  Constitutional:      General: She is not in acute distress.    Appearance: Normal appearance. She is well-developed. She is not ill-appearing.  HENT:     Head: Normocephalic and atraumatic.     Nose: Nose normal.     Mouth/Throat:     Mouth: Mucous membranes are moist.     Pharynx: Oropharynx is clear.  Eyes:     Conjunctiva/sclera: Conjunctivae normal.  Cardiovascular:     Rate and Rhythm: Normal rate and regular rhythm.     Heart sounds: Normal heart sounds. No murmur heard.   Pulmonary:     Effort: Pulmonary effort is normal. No respiratory  distress.     Breath sounds: Normal breath sounds. No stridor. No wheezing, rhonchi or rales.  Chest:     Chest wall: No tenderness.  Abdominal:     General: Bowel sounds are normal.     Palpations: Abdomen is soft.     Tenderness: There is no abdominal tenderness.  Musculoskeletal:        General: Normal range of motion.     Cervical back: Normal range of motion and neck supple.  Skin:    General: Skin is warm and dry.     Capillary Refill: Capillary refill takes less than 2 seconds.  Neurological:     General: No focal deficit present.     Mental Status: She is alert and oriented to person, place, and time.  Psychiatric:        Mood and Affect: Mood normal.        Behavior: Behavior normal.        Thought Content: Thought content normal.      UC Treatments / Results  Labs (all labs ordered are listed, but only abnormal results are displayed) Labs Reviewed - No data to display  EKG   Radiology No results found.  Procedures Procedures (including critical care time)  Medications Ordered in UC Medications - No data to display  Initial Impression / Assessment and Plan / UC Course  I have reviewed the triage vital signs and the nursing notes.  Pertinent labs & imaging results that were available during my care of the patient were reviewed by me and considered in my medical decision making (see chart for details).     Presents today for EKG Previous EKG had artifact, plastic surgeon's office would not accept this EKG normal today A copy provided to patient Follow-up with this office or primary care as needed  Final Clinical Impressions(s) / UC Diagnoses   Final diagnoses:  Encounter for preoperative examination for general surgical procedure   Discharge Instructions   None    ED Prescriptions  None     PDMP not reviewed this encounter.   Moshe Cipro, NP 05/25/20 [phone]

## 2020-05-24 NOTE — ED Triage Notes (Signed)
Pt states needs a EKG for pre op

## 2020-05-29 ENCOUNTER — Encounter: Payer: Self-pay | Admitting: Emergency Medicine

## 2020-05-29 ENCOUNTER — Ambulatory Visit
Admission: EM | Admit: 2020-05-29 | Discharge: 2020-05-29 | Disposition: A | Discharge status: Home / Self Care | Payer: Medicaid Other | Attending: Family Medicine | Admitting: Family Medicine

## 2020-05-29 DIAGNOSIS — Z01812 Encounter for preprocedural laboratory examination: Secondary | ICD-10-CM | POA: Diagnosis not present

## 2020-05-29 DIAGNOSIS — Z1152 Encounter for screening for COVID-19: Secondary | ICD-10-CM

## 2020-05-29 DIAGNOSIS — Z20822 Contact with and (suspected) exposure to covid-19: Secondary | ICD-10-CM | POA: Diagnosis not present

## 2020-05-29 NOTE — ED Triage Notes (Signed)
Pt had a headache the other day but states she was crying that day also says she had a runny nose. Her workplace is requiring her to have neg covid to come back to work.

## 2020-05-29 NOTE — Discharge Instructions (Signed)
Your COVID test is pending.  You should self quarantine until the test result is back.    Take Tylenol as needed for fever or discomfort.  Rest and keep yourself hydrated.    Go to the emergency department if you develop acute worsening symptoms.     

## 2020-05-29 NOTE — ED Provider Notes (Signed)
[facility]    CSN: [id number] Arrival date & time: 05/29/20  1905      History   Chief Complaint Chief Complaint  Patient presents with  . Labs Only    HPI Virginia Mills is a 24 y.o. female.   Reports that she needs a Covid test for travel. Denies known exposure to Covid. Denies headache, cough, sore throat, nausea, vomiting, diarrhea, fever, rash, other symptoms.  ROS per HPI  The history is provided by the patient.    Past Medical History:  Diagnosis Date  . Bell's palsy   . HSV-2 infection     Patient Active Problem List   Diagnosis Date Noted  . Depression 07/30/2018  . 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 Marijuana use 04/24/2013    Past Surgical History:  Procedure Laterality Date  . INDUCED ABORTION    . INDUCED ABORTION      OB History    Gravida  4   Para  2   Term  2   Preterm      AB  2   Living  2     SAB      TAB  2   Ectopic      Multiple      Live Births  2            Home Medications    Prior to Admission medications   Medication Sig Start Date End Date Taking? Authorizing Provider  amLODipine (NORVASC) 10 MG tablet Take 1 tablet (10 mg total) by mouth daily. Patient not taking: Reported on 05/15/2020 03/17/19   Conan Bowens, MD  aspirin-acetaminophen-caffeine [facility] MIGRAINE) [phone] MG tablet Take by mouth as needed for headache.    [provider]  diphenhydrAMINE-APAP, sleep, (TYLENOL PM EXTRA STRENGTH PO) Take 2 tablets by mouth as needed.     [provider]    Family History Family History  Problem Relation Age of Onset  . Diabetes Father   . Diabetes Paternal Grandmother   . Hypertension Paternal Grandmother   . Diabetes Paternal Grandfather   . Hypertension Paternal Grandfather   . Heart attack Paternal Grandfather   . Cancer Maternal Aunt   . Autism Cousin     Social History Social History   Tobacco Use  . Smoking status: Current Every Day Smoker     Packs/day: 0.00    Types: Cigarettes  . Smokeless tobacco: Never Used  . Tobacco comment: smokes 7 cig daily  Vaping Use  . Vaping Use: Never used  Substance Use Topics  . Alcohol use: Yes  . Drug use: No    Types: Marijuana    Comment: no longer uses     Allergies   Patient has no known allergies.   Review of Systems Review of Systems   Physical Exam Triage Vital Signs ED Triage Vitals [05/29/20 1923]  Enc Vitals Group     BP      Pulse      Resp      Temp      Temp src      SpO2      Weight 134 lb 7.7 oz (61 kg)     Height 5\' 5"  (1.651 m)     Head Circumference      Peak Flow      Pain Score 0     Pain Loc      Pain Edu?      Excl. in GC?  No data found.  Updated Vital Signs BP 114/70 (BP Location: Right Arm)   Pulse 81   Temp 98 F (36.7 C) (Oral)   Resp 17   Ht 5\' 5"  (1.651 m)   Wt 134 lb 7.7 oz (61 kg)   LMP 05/15/2020   SpO2 98%   BMI 22.38 kg/m   Visual Acuity Right Eye Distance:   Left Eye Distance:   Bilateral Distance:    Right Eye Near:   Left Eye Near:    Bilateral Near:     Physical Exam Vitals and nursing note reviewed.  Constitutional:      General: She is not in acute distress.    Appearance: She is well-developed.  HENT:     Head: Normocephalic and atraumatic.  Eyes:     Conjunctiva/sclera: Conjunctivae normal.  Cardiovascular:     Rate and Rhythm: Normal rate and regular rhythm.     Heart sounds: Normal heart sounds. No murmur heard.   Pulmonary:     Effort: Pulmonary effort is normal. No respiratory distress.     Breath sounds: Normal breath sounds. No stridor. No wheezing, rhonchi or rales.  Chest:     Chest wall: No tenderness.  Abdominal:     Palpations: Abdomen is soft.     Tenderness: There is no abdominal tenderness.  Musculoskeletal:        General: Normal range of motion.     Cervical back: Neck supple.  Skin:    General: Skin is warm and dry.     Capillary Refill: Capillary refill takes less than  2 seconds.  Neurological:     General: No focal deficit present.     Mental Status: She is alert and oriented to person, place, and time.  Psychiatric:        Mood and Affect: Mood normal.        Behavior: Behavior normal.        Thought Content: Thought content normal.      UC Treatments / Results  Labs (all labs ordered are listed, but only abnormal results are displayed) Labs Reviewed  NOVEL CORONAVIRUS, NAA   Narrative:    Performed at:  [address] [place], [place]  [place] Lab Director: [id number] MD, Phone:  [phone]  SARS-COV-2, NAA 2 DAY TAT   Narrative:    Performed at:  [address] [place], [place]  [place] Lab Director: [id number] MD, Phone:  [phone]    EKG   Radiology No results found.  Procedures Procedures (including critical care time)  Medications Ordered in UC Medications - No data to display  Initial Impression / Assessment and Plan / UC Course  I have reviewed the triage vital signs and the nursing notes.  Pertinent labs & imaging results that were available during my care of the patient were reviewed by me and considered in my medical decision making (see chart for details).     Covid Screen  Presents today for Covid test for travel Asymptomatic  Covid swab obtained in office today.  Patient instructed to quarantine until results are back and negative.  If results are negative, patient may resume daily schedule as tolerated once they are fever free for 24 hours without the use of antipyretic medications.  If results are positive, patient instructed to quarantine 10 days from today.  Patient instructed to follow-up with primary care with this office as needed.  Patient instructed to follow-up in the  ER for trouble swallowing, trouble breathing, other concerning symptoms.  Final Clinical Impressions(s) / UC Diagnoses   Final diagnoses:  Encounter for screening for  COVID-19     Discharge Instructions     Your COVID test is pending.  You should self quarantine until the test result is back.    Take Tylenol as needed for fever or discomfort.  Rest and keep yourself hydrated.    Go to the emergency department if you develop acute worsening symptoms.        ED Prescriptions    None     PDMP not reviewed this encounter.   Moshe Cipro, NP 06/04/20 2013

## 2020-05-30 LAB — NOVEL CORONAVIRUS, NAA: SARS-CoV-2, NAA: NOT DETECTED

## 2020-05-30 LAB — SARS-COV-2, NAA 2 DAY TAT

## 2021-01-24 IMAGING — DX DG CHEST 2V
2 series · 2 of 2 positions shown · non-contrast
Comparison: None.

CLINICAL DATA: Preop evaluation for cosmetic surgery

EXAM:
CHEST - 2 VIEW

[chest pa]
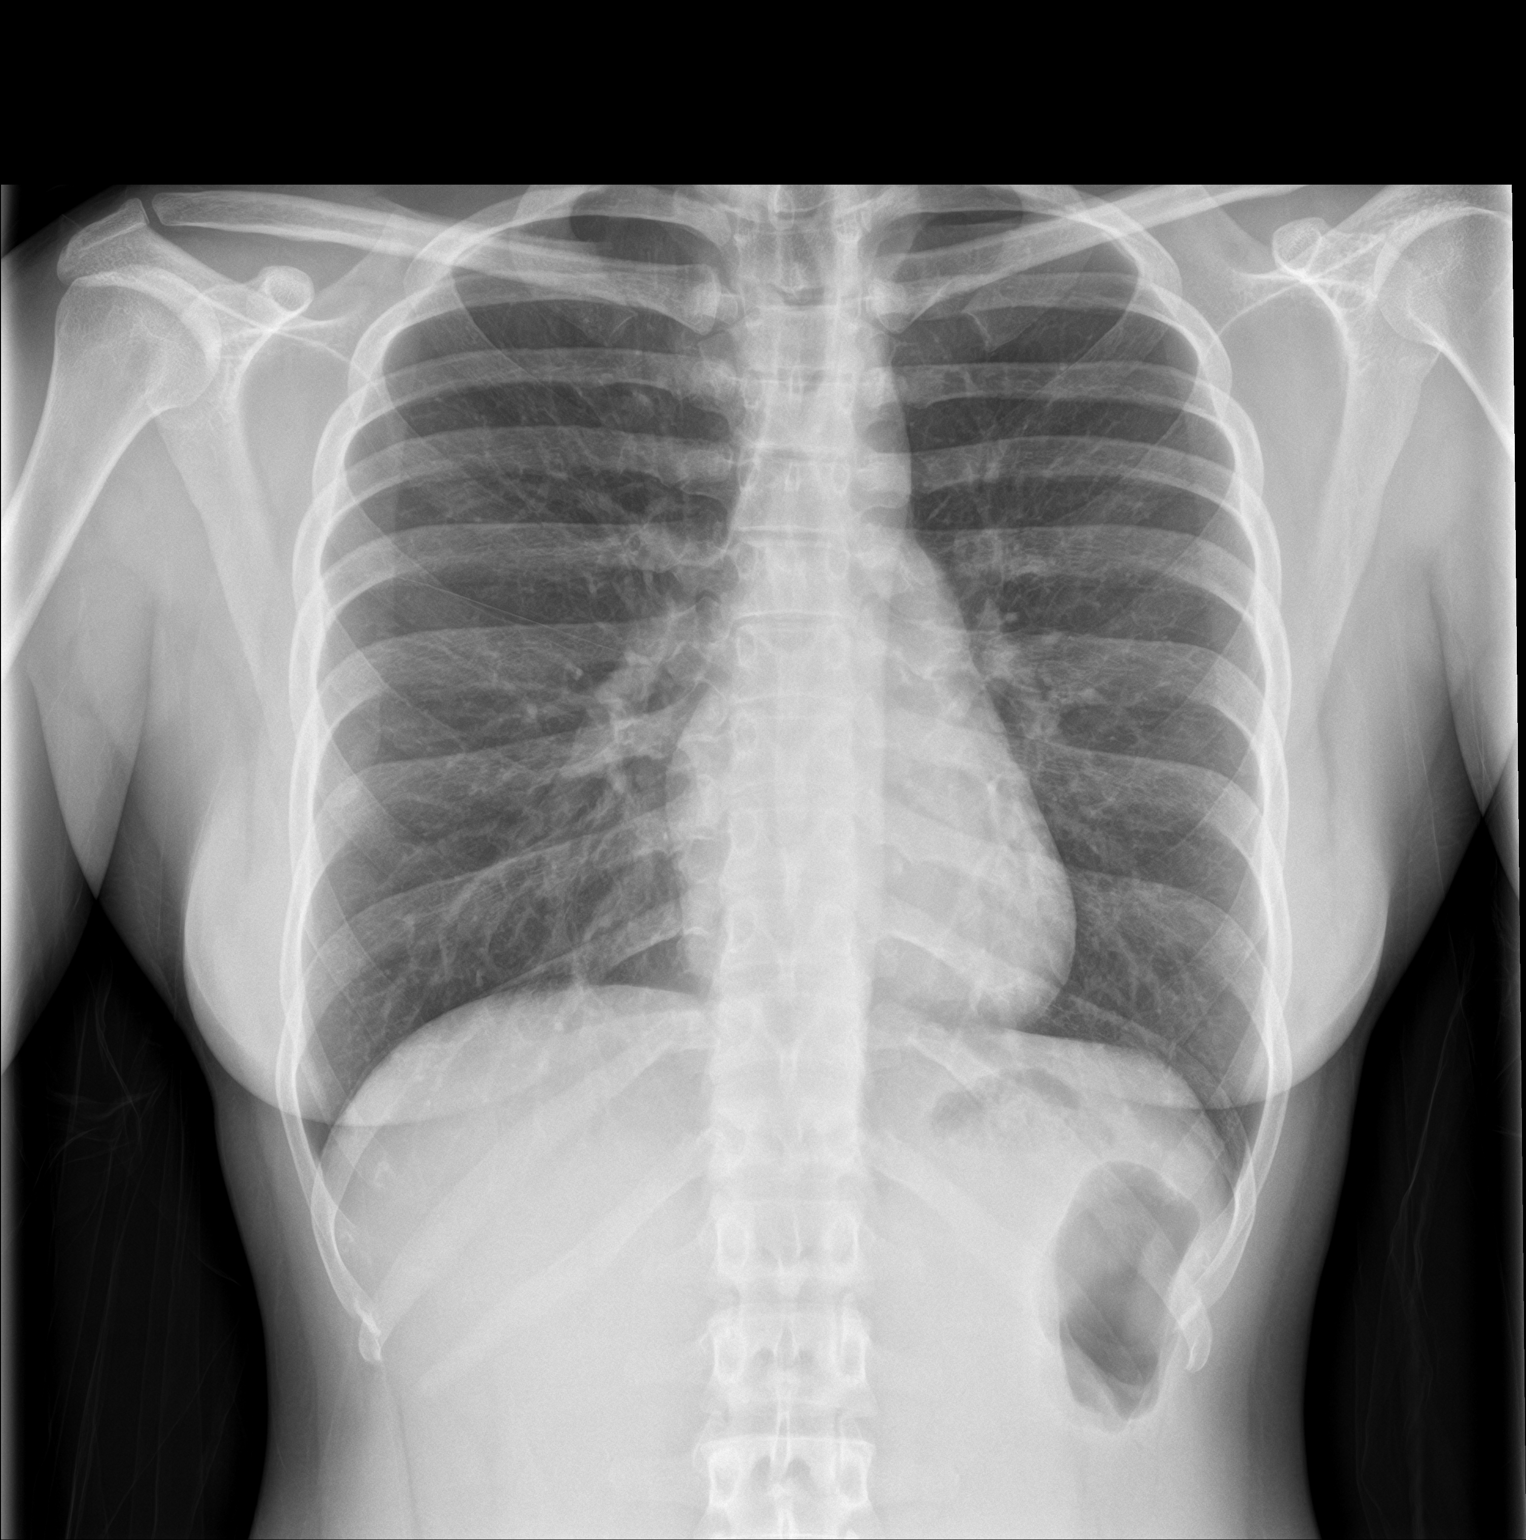

[chest lat]
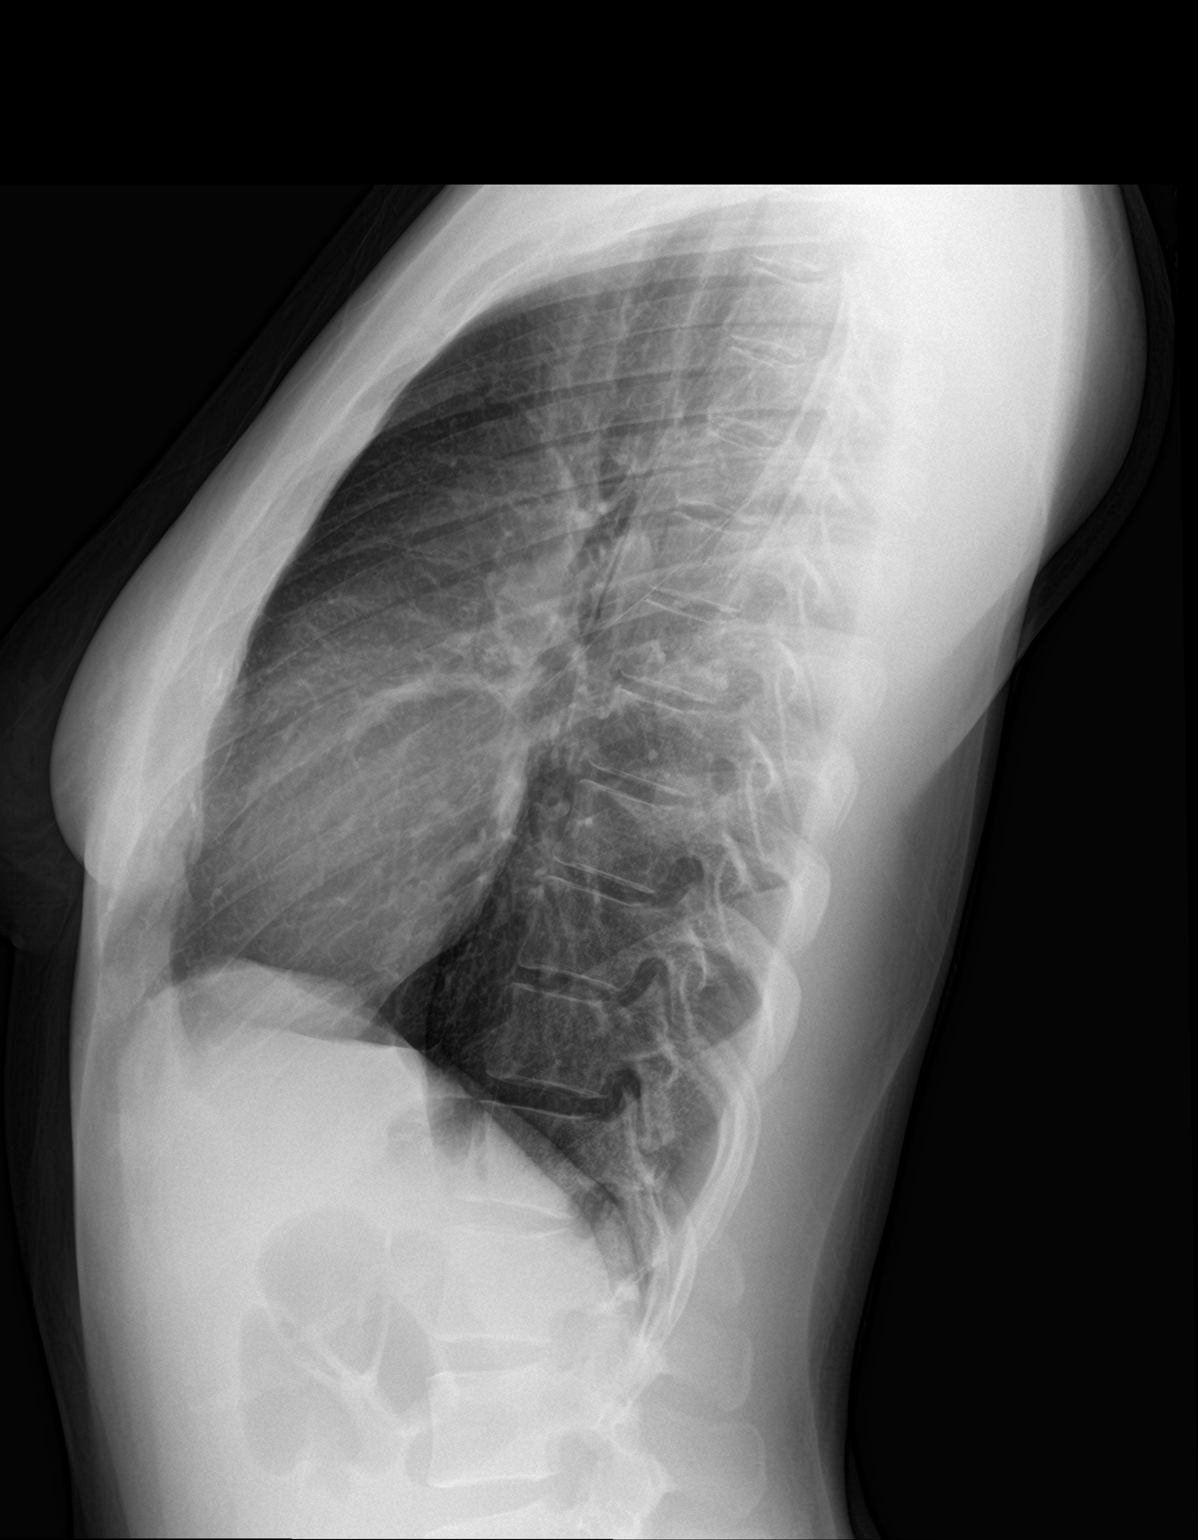

[2 of 2 positions shown; findings below may reference images not displayed]

FINDINGS: The heart size and mediastinal contours are within normal limits.
Both lungs are clear. The visualized skeletal structures are
unremarkable.
IMPRESSION: No active cardiopulmonary disease.

## 2021-05-18 ENCOUNTER — Other Ambulatory Visit: Payer: Medicaid Other | Admitting: Obstetrics & Gynecology

## 2022-10-12 DIAGNOSIS — E663 Overweight: Secondary | ICD-10-CM | POA: Diagnosis not present

## 2022-10-12 DIAGNOSIS — G510 Bell's palsy: Secondary | ICD-10-CM | POA: Diagnosis not present

## 2022-10-12 DIAGNOSIS — Z6827 Body mass index (BMI) 27.0-27.9, adult: Secondary | ICD-10-CM | POA: Diagnosis not present

## 2023-05-01 ENCOUNTER — Other Ambulatory Visit: Payer: Self-pay

## 2023-05-01 ENCOUNTER — Emergency Department (HOSPITAL_COMMUNITY): Payer: Medicaid Other

## 2023-05-01 ENCOUNTER — Encounter (HOSPITAL_COMMUNITY): Payer: Self-pay | Admitting: Emergency Medicine

## 2023-05-01 ENCOUNTER — Emergency Department (HOSPITAL_COMMUNITY)
Admission: EM | Admit: 2023-05-01 | Discharge: 2023-05-01 | Disposition: A | Discharge status: Home / Self Care | Payer: Medicaid Other | Attending: Student | Admitting: Student

## 2023-05-01 DIAGNOSIS — Z041 Encounter for examination and observation following transport accident: Secondary | ICD-10-CM | POA: Diagnosis not present

## 2023-05-01 DIAGNOSIS — Z7982 Long term (current) use of aspirin: Secondary | ICD-10-CM | POA: Diagnosis not present

## 2023-05-01 DIAGNOSIS — S83004A Unspecified dislocation of right patella, initial encounter: Secondary | ICD-10-CM | POA: Insufficient documentation

## 2023-05-01 DIAGNOSIS — F17210 Nicotine dependence, cigarettes, uncomplicated: Secondary | ICD-10-CM | POA: Insufficient documentation

## 2023-05-01 DIAGNOSIS — M25561 Pain in right knee: Secondary | ICD-10-CM | POA: Diagnosis present

## 2023-05-01 DIAGNOSIS — Y92410 Unspecified street and highway as the place of occurrence of the external cause: Secondary | ICD-10-CM | POA: Diagnosis not present

## 2023-05-01 DIAGNOSIS — S83094A Other dislocation of right patella, initial encounter: Secondary | ICD-10-CM | POA: Diagnosis not present

## 2023-05-01 MED ORDER — FENTANYL CITRATE PF 50 MCG/ML IJ SOSY
50.0000 ug | PREFILLED_SYRINGE | INTRAMUSCULAR | Status: DC | PRN
  Administered 2023-05-01: 50 ug via INTRAVENOUS
  Filled 2023-05-01: qty 1

## 2023-05-01 MED ORDER — ONDANSETRON HCL 4 MG/2ML IJ SOLN
4.0000 mg | Freq: Once | INTRAMUSCULAR | Status: AC
  Administered 2023-05-01: 4 mg via INTRAVENOUS
  Filled 2023-05-01: qty 2

## 2023-05-01 MED ORDER — HYDROCODONE-ACETAMINOPHEN 5-325 MG PO TABS
1.0000 | ORAL_TABLET | Freq: Once | ORAL | Status: AC
  Administered 2023-05-01: 1 via ORAL
  Filled 2023-05-01: qty 1

## 2023-05-01 MED ORDER — NAPROXEN 375 MG PO TABS: 375.0000 mg | ORAL_TABLET | Freq: Two times a day (BID) | ORAL | 0 refills | Status: DC

## 2023-05-01 MED ORDER — ACETAMINOPHEN 500 MG PO TABS
1000.0000 mg | ORAL_TABLET | Freq: Three times a day (TID) | ORAL | 0 refills | Status: AC
Start: 2023-05-01 — End: 2023-05-31

## 2023-05-01 MED ORDER — OXYCODONE HCL 5 MG PO TABS: 5.0000 mg | ORAL_TABLET | Freq: Four times a day (QID) | ORAL | 0 refills | Status: DC | PRN

## 2023-05-01 MED ORDER — KETOROLAC TROMETHAMINE 15 MG/ML IJ SOLN
15.0000 mg | Freq: Once | INTRAMUSCULAR | Status: AC
  Administered 2023-05-01: 15 mg via INTRAVENOUS
  Filled 2023-05-01: qty 1
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
## 2023-05-01 NOTE — Discharge Instructions (Addendum)
For pain:  - Acetaminophen 1000 mg three times daily (every 8 hours) - Naproxen 2 times daily (every 12 hours) - oxycodone for breakthrough pain only 

## 2023-05-26 ENCOUNTER — Ambulatory Visit: Payer: Medicaid Other | Admitting: Orthopedic Surgery

## 2023-05-26 VITALS — BP 129/76 | HR 63 | Ht 65.0 in | Wt 167.0 lb

## 2023-05-26 DIAGNOSIS — S8991XA Unspecified injury of right lower leg, initial encounter: Secondary | ICD-10-CM | POA: Diagnosis not present

## 2023-05-26 DIAGNOSIS — M25461 Effusion, right knee: Secondary | ICD-10-CM

## 2023-05-26 MED ORDER — IBUPROFEN 800 MG PO TABS
800.0000 mg | ORAL_TABLET | Freq: Three times a day (TID) | ORAL | 1 refills | Status: DC | PRN
Start: 2023-05-26 — End: 2023-06-27

## 2023-05-26 MED ORDER — OXYCODONE-ACETAMINOPHEN 10-325 MG PO TABS: 1.0000 | ORAL_TABLET | ORAL | 0 refills | Status: DC | PRN

## 2023-05-26 NOTE — Patient Instructions (Addendum)
Brace for walking , remove for bathing, optional for sleeping   Take the medications as prescribed   Call [phone] to schedule MRI while they work on approval from [employer]
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
## 2023-05-26 NOTE — Addendum Note (Signed)
Addended by: Debby Bud R on: 05/26/2023 10:14 AM   Modules accepted: Orders

## 2023-05-29 ENCOUNTER — Telehealth: Payer: Self-pay | Admitting: Orthopedic Surgery

## 2023-05-29 NOTE — Telephone Encounter (Signed)
I sent another message to [facility] at [facility]  Approval has already been received for [facility] imaging, probably too lte to change now.

## 2023-05-29 NOTE — Telephone Encounter (Signed)
Dr. Mort Sawyers pt - pt presented to the office stating she can't get anyone to call her back about scheduling her MRI.  She would like to try going somewhere else.  She would like a call back [phone].

## 2023-05-31 ENCOUNTER — Ambulatory Visit
Admission: RE | Admit: 2023-05-31 | Discharge: 2023-05-31 | Disposition: A | Discharge status: Home / Self Care | Payer: Medicaid Other | Source: Ambulatory Visit | Attending: Orthopedic Surgery | Admitting: Orthopedic Surgery

## 2023-05-31 DIAGNOSIS — S76111A Strain of right quadriceps muscle, fascia and tendon, initial encounter: Secondary | ICD-10-CM | POA: Diagnosis not present

## 2023-05-31 DIAGNOSIS — M25461 Effusion, right knee: Secondary | ICD-10-CM

## 2023-05-31 DIAGNOSIS — S82831A Other fracture of upper and lower end of right fibula, initial encounter for closed fracture: Secondary | ICD-10-CM | POA: Diagnosis not present

## 2023-05-31 DIAGNOSIS — S83014A Lateral dislocation of right patella, initial encounter: Secondary | ICD-10-CM | POA: Diagnosis not present

## 2023-05-31 DIAGNOSIS — S8991XA Unspecified injury of right lower leg, initial encounter: Secondary | ICD-10-CM

## 2023-06-05 ENCOUNTER — Other Ambulatory Visit: Payer: Self-pay | Admitting: Orthopedic Surgery

## 2023-06-05 DIAGNOSIS — M25461 Effusion, right knee: Secondary | ICD-10-CM

## 2023-06-05 DIAGNOSIS — T148XXA Other injury of unspecified body region, initial encounter: Secondary | ICD-10-CM

## 2023-06-05 DIAGNOSIS — S8991XA Unspecified injury of right lower leg, initial encounter: Secondary | ICD-10-CM

## 2023-06-05 MED ORDER — OXYCODONE-ACETAMINOPHEN 5-325 MG PO TABS: 1.0000 | ORAL_TABLET | ORAL | 0 refills | Status: AC | PRN

## 2023-06-05 NOTE — Telephone Encounter (Signed)
Dr. Mort Sawyers pt  - pt presented to the office, stated she had her MRI done, she wanted to know if the results were in, they have not finalized yet.  She scheduled a f/u for 8/23, next available.  She would like a refill on Oxycodone 10-325, 30 quantity, Every 4 hours PRN for pain to be sent to [facility].

## 2023-06-05 NOTE — Progress Notes (Signed)
Requested Prescriptions   Signed Prescriptions Disp Refills   oxyCODONE-acetaminophen (PERCOCET/ROXICET) 5-325 MG tablet 30 tablet 0    Sig: Take 1 tablet by mouth every 4 (four) hours as needed for up to 5 days for severe pain.    NEXT RX 1 Q 6 FOR 5 DAYS

## 2023-06-10 ENCOUNTER — Telehealth: Payer: Self-pay | Admitting: Orthopedic Surgery

## 2023-06-10 NOTE — Telephone Encounter (Signed)
Dr. Mort Sawyers pt - pt presented to the office irritated because she stated she has been calling and calling to see if her MRI results are in.  She said she calls [facility] and no one answers.  I did tell her the results have not finalized yet and that Dr. Romeo Apple will go over them w/her at there appointment.  She stated she was going to get a CD from [facility] because she needs to know what is going on.

## 2023-06-10 NOTE — Telephone Encounter (Signed)
It takes time for the MRI to result  I called her to advise  She states she will get the disk and have her chiropractor to read it.

## 2023-06-27 ENCOUNTER — Encounter: Payer: Self-pay | Admitting: Orthopedic Surgery

## 2023-06-27 ENCOUNTER — Ambulatory Visit: Payer: Medicaid Other | Admitting: Orthopedic Surgery

## 2023-06-27 ENCOUNTER — Telehealth: Payer: Self-pay | Admitting: Orthopedic Surgery

## 2023-06-27 DIAGNOSIS — M2201 Recurrent dislocation of patella, right knee: Secondary | ICD-10-CM

## 2023-06-27 DIAGNOSIS — S8991XA Unspecified injury of right lower leg, initial encounter: Secondary | ICD-10-CM

## 2023-06-27 DIAGNOSIS — S82831A Other fracture of upper and lower end of right fibula, initial encounter for closed fracture: Secondary | ICD-10-CM | POA: Diagnosis not present

## 2023-06-27 DIAGNOSIS — T148XXA Other injury of unspecified body region, initial encounter: Secondary | ICD-10-CM

## 2023-06-27 DIAGNOSIS — M25461 Effusion, right knee: Secondary | ICD-10-CM

## 2023-06-27 MED ORDER — IBUPROFEN 800 MG PO TABS: 800.0000 mg | ORAL_TABLET | Freq: Three times a day (TID) | ORAL | 1 refills | Status: DC | PRN

## 2023-06-27 MED ORDER — HYDROCODONE-ACETAMINOPHEN 5-325 MG PO TABS: 1.0000 | ORAL_TABLET | Freq: Four times a day (QID) | ORAL | 0 refills | Status: DC | PRN

## 2023-06-27 NOTE — Patient Instructions (Signed)
Physical therapy has been ordered for you at [place]. They should call you to schedule, [phone] is the phone number to call, if you want to call to schedule.   

## 2023-06-27 NOTE — Telephone Encounter (Signed)
Dr. Mort Sawyers pt - pt is checking out and is requesting pain medication.  She stated she didn't know she needed to ask for it at her appointment, she assumed he knew she was hurting.  She uses [facility].
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
## 2023-06-30 DIAGNOSIS — S8991XA Unspecified injury of right lower leg, initial encounter: Secondary | ICD-10-CM | POA: Diagnosis not present

## 2023-06-30 DIAGNOSIS — S82831A Other fracture of upper and lower end of right fibula, initial encounter for closed fracture: Secondary | ICD-10-CM | POA: Diagnosis not present

## 2023-06-30 DIAGNOSIS — M25461 Effusion, right knee: Secondary | ICD-10-CM | POA: Diagnosis not present

## 2023-07-08 ENCOUNTER — Telehealth: Payer: Self-pay | Admitting: Orthopedic Surgery

## 2023-07-08 NOTE — Telephone Encounter (Signed)
They have tried to contact her at first they didn t get answer, then someone picked up and disconnected [phone] is the phone number to call  I gave her the number she states she will call   She should continue the ibuprofen I called her to advise she states she is taking both the ibuprofen and the naproxen, I told her one or the other not both, she voiced understanding   I told her I will send message to Dr Romeo Apple to let him know that she states Hydrocodone Dr. Romeo Apple gave her isn't helping, she stated that she is having to take two at a time for it to even help a little bi t

## 2023-07-08 NOTE — Telephone Encounter (Signed)
Dr. Mort Sawyers pt - pt lvm stating the Hydrocodone Dr. Romeo Apple gave her isn't helping, she stated that she is having to take two at a time for it to even help a little bit.  She wants to know if he can call something in that will actually help.  She also stated that PT has not reached out to her.

## 2023-07-09 NOTE — Telephone Encounter (Signed)
I called her to advise she voiced understanding

## 2023-07-11 ENCOUNTER — Telehealth (HOSPITAL_COMMUNITY): Payer: Self-pay

## 2023-07-11 NOTE — Telephone Encounter (Signed)
Pt confirmed appt for 07/14/23

## 2023-07-14 ENCOUNTER — Ambulatory Visit (HOSPITAL_COMMUNITY): Payer: Medicaid Other | Admitting: Psychiatry

## 2023-07-25 ENCOUNTER — Other Ambulatory Visit: Payer: Self-pay

## 2023-07-25 ENCOUNTER — Ambulatory Visit (HOSPITAL_COMMUNITY): Payer: Medicaid Other | Attending: Orthopedic Surgery

## 2023-07-25 DIAGNOSIS — S8991XA Unspecified injury of right lower leg, initial encounter: Secondary | ICD-10-CM | POA: Diagnosis not present

## 2023-07-25 DIAGNOSIS — S82831A Other fracture of upper and lower end of right fibula, initial encounter for closed fracture: Secondary | ICD-10-CM | POA: Diagnosis not present

## 2023-07-25 DIAGNOSIS — R262 Difficulty in walking, not elsewhere classified: Secondary | ICD-10-CM | POA: Diagnosis not present

## 2023-07-25 DIAGNOSIS — M2201 Recurrent dislocation of patella, right knee: Secondary | ICD-10-CM | POA: Insufficient documentation

## 2023-07-25 DIAGNOSIS — M25461 Effusion, right knee: Secondary | ICD-10-CM | POA: Insufficient documentation

## 2023-07-25 DIAGNOSIS — T148XXA Other injury of unspecified body region, initial encounter: Secondary | ICD-10-CM | POA: Diagnosis not present

## 2023-07-25 DIAGNOSIS — M25561 Pain in right knee: Secondary | ICD-10-CM | POA: Diagnosis not present
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
## 2023-07-31 ENCOUNTER — Ambulatory Visit (HOSPITAL_COMMUNITY): Payer: Medicaid Other | Admitting: Physical Therapy

## 2023-07-31 DIAGNOSIS — S8991XA Unspecified injury of right lower leg, initial encounter: Secondary | ICD-10-CM | POA: Diagnosis not present

## 2023-07-31 DIAGNOSIS — R262 Difficulty in walking, not elsewhere classified: Secondary | ICD-10-CM

## 2023-07-31 DIAGNOSIS — M25561 Pain in right knee: Secondary | ICD-10-CM

## 2023-07-31 DIAGNOSIS — M25461 Effusion, right knee: Secondary | ICD-10-CM | POA: Diagnosis not present

## 2023-07-31 DIAGNOSIS — M2201 Recurrent dislocation of patella, right knee: Secondary | ICD-10-CM | POA: Diagnosis not present

## 2023-07-31 DIAGNOSIS — T148XXA Other injury of unspecified body region, initial encounter: Secondary | ICD-10-CM | POA: Diagnosis not present

## 2023-07-31 DIAGNOSIS — S82831A Other fracture of upper and lower end of right fibula, initial encounter for closed fracture: Secondary | ICD-10-CM | POA: Diagnosis not present
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
## 2023-08-07 ENCOUNTER — Ambulatory Visit (HOSPITAL_COMMUNITY): Payer: Medicaid Other | Attending: Orthopedic Surgery | Admitting: Physical Therapy

## 2023-08-07 DIAGNOSIS — M25561 Pain in right knee: Secondary | ICD-10-CM | POA: Diagnosis not present

## 2023-08-07 DIAGNOSIS — R262 Difficulty in walking, not elsewhere classified: Secondary | ICD-10-CM | POA: Diagnosis not present
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
## 2023-08-12 ENCOUNTER — Ambulatory Visit (HOSPITAL_COMMUNITY): Payer: Medicaid Other | Admitting: Physical Therapy

## 2023-08-12 DIAGNOSIS — R262 Difficulty in walking, not elsewhere classified: Secondary | ICD-10-CM | POA: Diagnosis not present

## 2023-08-12 DIAGNOSIS — M25561 Pain in right knee: Secondary | ICD-10-CM | POA: Diagnosis not present
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
## 2023-08-20 ENCOUNTER — Encounter (HOSPITAL_COMMUNITY): Payer: Self-pay

## 2023-08-20 ENCOUNTER — Ambulatory Visit (HOSPITAL_COMMUNITY): Payer: Medicaid Other

## 2023-08-20 ENCOUNTER — Encounter (HOSPITAL_COMMUNITY): Payer: Medicaid Other | Admitting: Physical Therapy

## 2023-08-20 DIAGNOSIS — M25561 Pain in right knee: Secondary | ICD-10-CM | POA: Diagnosis not present

## 2023-08-20 DIAGNOSIS — R262 Difficulty in walking, not elsewhere classified: Secondary | ICD-10-CM
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
## 2023-08-25 ENCOUNTER — Encounter (HOSPITAL_COMMUNITY): Payer: Medicaid Other

## 2023-08-28 ENCOUNTER — Ambulatory Visit (HOSPITAL_COMMUNITY): Payer: Medicaid Other

## 2023-08-28 DIAGNOSIS — R262 Difficulty in walking, not elsewhere classified: Secondary | ICD-10-CM | POA: Diagnosis not present

## 2023-08-28 DIAGNOSIS — M25561 Pain in right knee: Secondary | ICD-10-CM
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
## 2023-09-01 ENCOUNTER — Ambulatory Visit (HOSPITAL_COMMUNITY): Payer: Medicaid Other

## 2023-09-01 DIAGNOSIS — R262 Difficulty in walking, not elsewhere classified: Secondary | ICD-10-CM

## 2023-09-01 DIAGNOSIS — M25561 Pain in right knee: Secondary | ICD-10-CM | POA: Diagnosis not present
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
## 2023-09-08 ENCOUNTER — Ambulatory Visit (HOSPITAL_COMMUNITY): Payer: Medicaid Other | Attending: Orthopedic Surgery

## 2023-09-08 DIAGNOSIS — R262 Difficulty in walking, not elsewhere classified: Secondary | ICD-10-CM | POA: Insufficient documentation

## 2023-09-08 DIAGNOSIS — M25561 Pain in right knee: Secondary | ICD-10-CM | POA: Diagnosis not present
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
## 2023-09-16 ENCOUNTER — Encounter (HOSPITAL_COMMUNITY): Payer: Medicaid Other | Admitting: Physical Therapy

## 2023-09-22 ENCOUNTER — Encounter (HOSPITAL_COMMUNITY): Payer: Medicaid Other | Admitting: Physical Therapy

## 2023-09-23 ENCOUNTER — Ambulatory Visit (HOSPITAL_COMMUNITY): Payer: Medicaid Other

## 2023-09-23 DIAGNOSIS — M25561 Pain in right knee: Secondary | ICD-10-CM | POA: Diagnosis not present

## 2023-09-23 DIAGNOSIS — R262 Difficulty in walking, not elsewhere classified: Secondary | ICD-10-CM | POA: Diagnosis not present
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
## 2023-09-25 ENCOUNTER — Encounter: Payer: Self-pay | Admitting: Obstetrics & Gynecology

## 2023-09-25 ENCOUNTER — Ambulatory Visit: Payer: Medicaid Other | Admitting: Obstetrics & Gynecology

## 2023-09-25 ENCOUNTER — Other Ambulatory Visit (HOSPITAL_COMMUNITY)
Admission: RE | Admit: 2023-09-25 | Discharge: 2023-09-25 | Disposition: A | Discharge status: Home / Self Care | Payer: Medicaid Other | Source: Ambulatory Visit | Attending: Obstetrics & Gynecology | Admitting: Obstetrics & Gynecology

## 2023-09-25 VITALS — BP 127/79 | HR 65 | Ht 65.0 in | Wt 171.0 lb

## 2023-09-25 DIAGNOSIS — Z01419 Encounter for gynecological examination (general) (routine) without abnormal findings: Secondary | ICD-10-CM | POA: Insufficient documentation

## 2023-09-25 DIAGNOSIS — Z113 Encounter for screening for infections with a predominantly sexual mode of transmission: Secondary | ICD-10-CM | POA: Diagnosis not present

## 2023-09-25 DIAGNOSIS — Z3202 Encounter for pregnancy test, result negative: Secondary | ICD-10-CM

## 2023-09-25 LAB — POCT URINE PREGNANCY: Preg Test, Ur: NEGATIVE

## 2023-09-25 NOTE — Progress Notes (Signed)
Subjective:     Virginia Mills is a 27 y.o. female here for a routine exam.  Patient's last menstrual period was 08/15/2023 (exact date). [id number] Birth Control Method:  none really(nexplanon has expired) Menstrual Calendar(currently): regular  Current complaints: none.   Current acute medical issues:  none   Recent Gynecologic History Patient's last menstrual period was 08/15/2023 (exact date). Last Pap: 2021,  normal Last mammogram: na,    Past Medical History:  Diagnosis Date   Bell's palsy    HSV-2 infection     Past Surgical History:  Procedure Laterality Date   COSMETIC SURGERY     "[place] Butt Lift"   INDUCED ABORTION     INDUCED ABORTION      OB History     Gravida  4   Para  2   Term  2   Preterm      AB  2   Living  2      SAB      IAB  2   Ectopic      Multiple      Live Births  2           Social History   Socioeconomic History   Marital status: Significant Other    Spouse name: Not on file   Number of children: 2   Years of education: 10    Highest education level: 10th grade  Occupational History   Not on file  Tobacco Use   Smoking status: Every Day    Types: Cigarettes   Smokeless tobacco: Never   Tobacco comments:    smokes 7 cig daily  Vaping Use   Vaping status: Former  Substance and Sexual Activity   Alcohol use: Yes   Drug use: Yes    Types: Marijuana    Comment: no longer uses   Sexual activity: Yes    Birth control/protection: None, Implant    Comment: I think my implant expired a year ago  Other Topics Concern   Not on file  Social History Narrative   Not on file   Social Determinants of Health   Financial Resource Strain: Low Risk  (09/25/2023)   Overall Financial Resource Strain (CARDIA)    Difficulty of Paying Living Expenses: Not very hard  Food Insecurity: Food Insecurity Present (09/25/2023)   Hunger Vital Sign    Worried About Running Out of Food in the Last Year: Sometimes true     Ran Out of Food in the Last Year: Never true  Transportation Needs: No Transportation Needs (09/25/2023)   PRAPARE - Administrator, Civil Service (Medical): No    Lack of Transportation (Non-Medical): No  Physical Activity: Insufficiently Active (09/25/2023)   Exercise Vital Sign    Days of Exercise per Week: 2 days    Minutes of Exercise per Session: 20 min  Stress: Stress Concern Present (09/25/2023)   [employer] of Occupational Health - Occupational Stress Questionnaire    Feeling of Stress : Very much  Social Connections: Moderately Isolated (09/25/2023)   Social Connection and Isolation Panel [NHANES]    Frequency of Communication with Friends and Family: Twice a week    Frequency of Social Gatherings with Friends and Family: Once a week    Attends Religious Services: Never    Database administrator or Organizations: No    Attends Banker Meetings: Never    Marital Status: Living with partner    Family History  Problem  Relation Age of Onset   Diabetes Paternal Grandfather    Hypertension Paternal Grandfather    Heart attack Paternal Grandfather    Diabetes Paternal Grandmother    Hypertension Paternal Grandmother    Diabetes Father    Hypertension Father    Cancer Maternal Aunt    Autism Cousin      Current Outpatient Medications:    aspirin-acetaminophen-caffeine (EXCEDRIN MIGRAINE) 250-250-65 MG tablet, Take by mouth as needed for headache., Disp: , Rfl:    diphenhydrAMINE-APAP, sleep, (TYLENOL PM EXTRA STRENGTH PO), Take 2 tablets by mouth as needed. , Disp: , Rfl:    ibuprofen (ADVIL) 800 MG tablet, Take 1 tablet (800 mg total) by mouth every 8 (eight) hours as needed., Disp: 90 tablet, Rfl: 1   naproxen (NAPROSYN) 375 MG tablet, Take 1 tablet (375 mg total) by mouth 2 (two) times daily., Disp: 20 tablet, Rfl: 0   amLODipine (NORVASC) 10 MG tablet, Take 1 tablet (10 mg total) by mouth daily. (Patient not taking: Reported on 05/26/2023),  Disp: 30 tablet, Rfl: 0   HYDROcodone-acetaminophen (NORCO/VICODIN) 5-325 MG tablet, Take 1 tablet by mouth every 6 (six) hours as needed for moderate pain. (Patient not taking: Reported on 09/25/2023), Disp: 30 tablet, Rfl: 0  Review of Systems  Review of Systems  Constitutional: Negative for fever, chills, weight loss, malaise/fatigue and diaphoresis.  HENT: Negative for hearing loss, ear pain, nosebleeds, congestion, sore throat, neck pain, tinnitus and ear discharge.   Eyes: Negative for blurred vision, double vision, photophobia, pain, discharge and redness.  Respiratory: Negative for cough, hemoptysis, sputum production, shortness of breath, wheezing and stridor.   Cardiovascular: Negative for chest pain, palpitations, orthopnea, claudication, leg swelling and PND.  Gastrointestinal: negative for abdominal pain. Negative for heartburn, nausea, vomiting, diarrhea, constipation, blood in stool and melena.  Genitourinary: Negative for dysuria, urgency, frequency, hematuria and flank pain.  Musculoskeletal: Negative for myalgias, back pain, joint pain and falls.  Skin: Negative for itching and rash.  Neurological: Negative for dizziness, tingling, tremors, sensory change, speech change, focal weakness, seizures, loss of consciousness, weakness and headaches.  Endo/Heme/Allergies: Negative for environmental allergies and polydipsia. Does not bruise/bleed easily.  Psychiatric/Behavioral: Negative for depression, suicidal ideas, hallucinations, memory loss and substance abuse. The patient is not nervous/anxious and does not have insomnia.        Objective:  Blood pressure 127/79, pulse 65, height 5\' 5"  (1.651 m), weight 171 lb (77.6 kg), last menstrual period 08/15/2023.   Physical Exam  Vitals reviewed. Constitutional: She is oriented to person, place, and time. She appears well-developed and well-nourished.  HENT:  Head: Normocephalic and atraumatic.        Right Ear: External ear  normal.  Left Ear: External ear normal.  Nose: Nose normal.  Mouth/Throat: Oropharynx is clear and moist.  Eyes: Conjunctivae and EOM are normal. Pupils are equal, round, and reactive to light. Right eye exhibits no discharge. Left eye exhibits no discharge. No scleral icterus.  Neck: Normal range of motion. Neck supple. No tracheal deviation present. No thyromegaly present.  Cardiovascular: Normal rate, regular rhythm, normal heart sounds and intact distal pulses.  Exam reveals no gallop and no friction rub.   No murmur heard. Respiratory: Effort normal and breath sounds normal. No respiratory distress. She has no wheezes. She has no rales. She exhibits no tenderness.  GI: Soft. Bowel sounds are normal. She exhibits no distension and no mass. There is no tenderness. There is no rebound and no guarding.  Genitourinary:  Breasts no masses skin changes or nipple changes bilaterally      Vulva is normal without lesions Vagina is pink moist without discharge Cervix normal in appearance and pap is done Uterus is normal size shape and contour Adnexa is negative with normal sized ovaries   Musculoskeletal: Normal range of motion. She exhibits no edema and no tenderness.  Neurological: She is alert and oriented to person, place, and time. She has normal reflexes. She displays normal reflexes. No cranial nerve deficit. She exhibits normal muscle tone. Coordination normal.  Skin: Skin is warm and dry. No rash noted. No erythema. No pallor.  Psychiatric: She has a normal mood and affect. Her behavior is normal. Judgment and thought content normal.       Medications Ordered at today's visit: No orders of the defined types were placed in this encounter.   Other orders placed at today's visit: Orders Placed This Encounter  Procedures   POCT urine pregnancy     ASSESSMENT + PLAN:    ICD-10-CM   1. Well woman exam with routine gynecological exam  Z01.419     2. Negative pregnancy test   Z32.02 POCT urine pregnancy      Nexplanon overdue to be removed, will schedule removal reinsertion appt in 2-3 weeks     Return in about 2 weeks (around 10/09/2023) for nexplanon removal + reinsertion.

## 2023-09-25 NOTE — Addendum Note (Signed)
Addended by: Caralyn Guile on: 09/25/2023 04:13 PM   Modules accepted: Orders

## 2023-10-01 ENCOUNTER — Encounter (HOSPITAL_COMMUNITY): Payer: Medicaid Other

## 2023-10-01 LAB — CYTOLOGY - PAP
Chlamydia: NEGATIVE
Comment: NEGATIVE
Comment: NEGATIVE
Comment: NEGATIVE
Comment: NORMAL
Diagnosis: NEGATIVE
Diagnosis: REACTIVE
High risk HPV: NEGATIVE
Neisseria Gonorrhea: NEGATIVE
Trichomonas: NEGATIVE

## 2023-10-09 ENCOUNTER — Encounter: Payer: Medicaid Other | Admitting: Advanced Practice Midwife

## 2023-10-16 ENCOUNTER — Ambulatory Visit: Payer: Medicaid Other | Admitting: Orthopedic Surgery

## 2023-10-27 ENCOUNTER — Ambulatory Visit: Payer: Medicaid Other | Admitting: Orthopedic Surgery

## 2023-10-27 ENCOUNTER — Encounter: Payer: Self-pay | Admitting: Orthopedic Surgery

## 2023-10-27 DIAGNOSIS — S83004D Unspecified dislocation of right patella, subsequent encounter: Secondary | ICD-10-CM | POA: Diagnosis not present

## 2023-10-27 MED ORDER — SULINDAC 150 MG PO TABS: 150.0000 mg | ORAL_TABLET | Freq: Two times a day (BID) | ORAL | 1 refills | Status: DC

## 2023-10-27 NOTE — Progress Notes (Signed)
FOLLOW-UP OFFICE VISIT   Patient: Virginia Mills           Date of Birth: October 01, 1996           MRN: [id number] Visit Date: 10/27/2023 Requested by: No referring provider defined for this encounter. PCP: Patient, No Pcp Per    Encounter Diagnosis  Name Primary?   Closed dislocation of right patella, subsequent encounter Yes    Chief Complaint  Patient presents with   Knee Pain    Better right knee but still has pain with certain movements/ activity has gone for the physical therapy     27 year old female dislocation right patella with patella contusion trochlear contusion fibular head fracture and gastroc tear  She is completed her physical therapy  She has made some improvements but still has pain in the knee and also in the gastroc area  Her knee tracks normally today in terms of her patellofemoral joint she has peripatellar pain to palpation no instability in the joint some tenderness in the popliteal space near the gastroc tear  ASSESSMENT AND PLAN This appears to be a nonoperative injury  She is not getting good pain relief from ibuprofen or naproxen  So we will change her anti-inflammatory She is encouraged to do physical therapy at home Return in 6 to 8 weeks for follow-up   Meds ordered this encounter  Medications   sulindac (CLINORIL) 150 MG tablet    Sig: Take 1 tablet (150 mg total) by mouth 2 (two) times daily.    Dispense:  60 tablet    Refill:  1   Patellofemoral: Osteochondral impaction injury of the inferior aspect of the medial patella. No trochlear cartilage injury.   Medial:  No chondral defect.   Lateral:  No chondral defect.   MISCELLANEOUS   Joint: Small joint effusion. Posttraumatic edema within the superior aspect of Hoffa's fat.   Popliteal Fossa:  No Baker's cyst. Intact popliteus tendon.   Extensor Mechanism: Intact quadriceps and patellar tendons. Partial tearing of the patellar attachment of the medial  patellofemoral ligament.   Bones: Bone contusions involving the inferomedial aspect of the patella and peripheral aspect of the lateral femoral condyle without a clearly defined fracture line.   Minimally displaced fracture along the medial aspect of the fibular head (series 3, image 24) with associated bone marrow edema. Knee joint alignment is maintained. TT-TG distance of 9 mm.   Other: Small fluid collection within the superficial soft tissues overlying the proximal aspect of the medial head of the gastrocnemius muscle measuring 5.7 x 0.3 x 2.5 cm.   IMPRESSION: 1. Findings consistent with recent transient lateral patellar dislocation with bone contusions of the inferomedial aspect of the patella and peripheral aspect of the lateral femoral condyle. Overlying cartilage injury of the patella. 2. Partial tearing of the patellar attachment of the MPFL. 3. Minimally displaced fracture of the fibular head. 4. Small joint effusion. 5. Small fluid collection within the superficial soft tissues overlying the proximal aspect of the medial head of the gastrocnemius muscle measuring 5.7 x 0.3 x 2.5 cm, likely a hematoma. This could also potentially represent a site of fascial degloving injury. 6. Intact menisci.     Electronically Signed   By: Duanne Guess D.O.   On: 06/10/2023 12:52

## 2023-11-12 ENCOUNTER — Telehealth: Payer: Self-pay | Admitting: Orthopedic Surgery

## 2023-11-12 DIAGNOSIS — M25461 Effusion, right knee: Secondary | ICD-10-CM

## 2023-11-12 DIAGNOSIS — S8991XA Unspecified injury of right lower leg, initial encounter: Secondary | ICD-10-CM

## 2023-12-29 ENCOUNTER — Ambulatory Visit: Payer: Medicaid Other | Admitting: Obstetrics & Gynecology

## 2023-12-29 ENCOUNTER — Encounter: Payer: Self-pay | Admitting: Obstetrics & Gynecology

## 2023-12-29 ENCOUNTER — Ambulatory Visit: Payer: Medicaid Other | Admitting: Orthopedic Surgery

## 2023-12-29 VITALS — BP 133/81 | HR 80 | Ht 65.0 in | Wt 163.0 lb

## 2023-12-29 DIAGNOSIS — Z01818 Encounter for other preprocedural examination: Secondary | ICD-10-CM

## 2023-12-29 NOTE — Progress Notes (Signed)
 Here for brief encounter for medical clearance before elective cosmetic surgery  Past Medical History:  Diagnosis Date   Bell's palsy    HSV-2 infection    Pre-eclampsia     Past Surgical History:  Procedure Laterality Date   COSMETIC SURGERY     "[place] Butt Lift"   INDUCED ABORTION     INDUCED ABORTION      OB History     Gravida  4   Para  2   Term  2   Preterm      AB  2   Living  2      SAB      IAB  2   Ectopic      Multiple      Live Births  2           No Known Allergies  Social History   Socioeconomic History   Marital status: Significant Other    Spouse name: Not on file   Number of children: 2   Years of education: 10    Highest education level: 10th grade  Occupational History   Not on file  Tobacco Use   Smoking status: Every Day    Current packs/day: 0.25    Types: Cigarettes   Smokeless tobacco: Never   Tobacco comments:    smokes 7 cig daily  Vaping Use   Vaping status: Former  Substance and Sexual Activity   Alcohol use: Yes    Comment: occac   Drug use: Yes    Types: Marijuana   Sexual activity: Yes    Birth control/protection: None, Implant, Rhythm  Other Topics Concern   Not on file  Social History Narrative   Not on file   Social Drivers of Health   Financial Resource Strain: Low Risk  (09/25/2023)   Overall Financial Resource Strain (CARDIA)    Difficulty of Paying Living Expenses: Not very hard  Food Insecurity: Food Insecurity Present (09/25/2023)   Hunger Vital Sign    Worried About Running Out of Food in the Last Year: Sometimes true    Ran Out of Food in the Last Year: Never true  Transportation Needs: No Transportation Needs (09/25/2023)   PRAPARE - Administrator, Civil Service (Medical): No    Lack of Transportation (Non-Medical): No  Physical Activity: Insufficiently Active (09/25/2023)   Exercise Vital Sign    Days of Exercise per Week: 2 days    Minutes of Exercise per  Session: 20 min  Stress: Stress Concern Present (09/25/2023)   [employer] of Occupational Health - Occupational Stress Questionnaire    Feeling of Stress : Very much  Social Connections: Moderately Isolated (09/25/2023)   Social Connection and Isolation Panel [NHANES]    Frequency of Communication with Friends and Family: Twice a week    Frequency of Social Gatherings with Friends and Family: Once a week    Attends Religious Services: Never    Database administrator or Organizations: No    Attends Engineer, structural: Never    Marital Status: Living with partner    Family History  Problem Relation Age of Onset   Diabetes Paternal Grandfather    Hypertension Paternal Grandfather    Heart attack Paternal Grandfather    Diabetes Paternal Grandmother    Hypertension Paternal Grandmother    Diabetes Father    Hypertension Father    Cancer Maternal Aunt    Autism Cousin   Lungs clear CV RRR w/o  MRG  Ok for elective cosmetic surgery

## 2024-01-13 ENCOUNTER — Ambulatory Visit: Payer: Medicaid Other | Admitting: Obstetrics & Gynecology

## 2024-01-13 VITALS — BP 130/85 | HR 58

## 2024-01-13 DIAGNOSIS — Z3046 Encounter for surveillance of implantable subdermal contraceptive: Secondary | ICD-10-CM

## 2024-01-13 DIAGNOSIS — Z3202 Encounter for pregnancy test, result negative: Secondary | ICD-10-CM

## 2024-01-13 LAB — POCT URINE PREGNANCY: Preg Test, Ur: NEGATIVE

## 2024-01-13 MED ORDER — ETONOGESTREL 68 MG ~~LOC~~ IMPL
68.0000 mg | DRUG_IMPLANT | Freq: Once | SUBCUTANEOUS | Status: AC
  Administered 2024-01-13: 68 mg via SUBCUTANEOUS

## 2024-01-13 NOTE — Progress Notes (Signed)
 Here for nexplanon removal:  Site of incision marked and device is palpated Right arm prepped with betadine 3 cc of 1% lidocaine injected Stab incision made with #11 blade Curved hemostat used to grab the nexplanon device and traction applied to remove Performed without difficulty Steri strips placed Bandaged Local care instructions given  Lazaro Arms, MD   Nexplanon Insertion Note:  [id number]  UPT: done: negative  Pt presents desiring placement of LARC in the form of Nexplanon She has been counseled regarding various methods including OCP, nuva ring, levonorgestrel IUD, Depo Provera injections and Nexplanon.  She has re Psychologist, occupational and understands its primary unpleasant side effect of irregular bleeding.     The Nexplanon device is then inserted into the previously anesthetized area. It is released without difficulty. It is palpable by both patient and myself. There is minimal bleeding.  The stab incision is reapproximated with 3 steri strips. A wrap gauze dressing is placed which patient will leave in place for 2 days or so.

## 2024-05-26 ENCOUNTER — Telehealth: Payer: Self-pay | Admitting: Orthopaedic Surgery

## 2024-05-26 DIAGNOSIS — M25461 Effusion, right knee: Secondary | ICD-10-CM

## 2024-05-26 DIAGNOSIS — S8991XA Unspecified injury of right lower leg, initial encounter: Secondary | ICD-10-CM

## 2024-06-25 ENCOUNTER — Encounter: Payer: Self-pay | Admitting: Radiology

## 2024-07-23 DIAGNOSIS — F439 Reaction to severe stress, unspecified: Secondary | ICD-10-CM | POA: Diagnosis not present

## 2024-07-27 DIAGNOSIS — F439 Reaction to severe stress, unspecified: Secondary | ICD-10-CM | POA: Diagnosis not present

## 2024-08-03 DIAGNOSIS — F439 Reaction to severe stress, unspecified: Secondary | ICD-10-CM | POA: Diagnosis not present

## 2024-08-23 ENCOUNTER — Emergency Department (HOSPITAL_COMMUNITY)

## 2024-08-23 ENCOUNTER — Other Ambulatory Visit: Payer: Self-pay

## 2024-08-23 ENCOUNTER — Emergency Department (HOSPITAL_COMMUNITY)
Admission: EM | Admit: 2024-08-23 | Discharge: 2024-08-23 | Disposition: A | Discharge status: Home / Self Care | Attending: Emergency Medicine | Admitting: Emergency Medicine

## 2024-08-23 ENCOUNTER — Encounter (HOSPITAL_COMMUNITY): Payer: Self-pay

## 2024-08-23 DIAGNOSIS — R0789 Other chest pain: Secondary | ICD-10-CM | POA: Diagnosis not present

## 2024-08-23 DIAGNOSIS — M549 Dorsalgia, unspecified: Secondary | ICD-10-CM | POA: Diagnosis not present

## 2024-08-23 DIAGNOSIS — Z7982 Long term (current) use of aspirin: Secondary | ICD-10-CM | POA: Diagnosis not present

## 2024-08-23 DIAGNOSIS — S39012A Strain of muscle, fascia and tendon of lower back, initial encounter: Secondary | ICD-10-CM | POA: Insufficient documentation

## 2024-08-23 DIAGNOSIS — M25462 Effusion, left knee: Secondary | ICD-10-CM | POA: Diagnosis not present

## 2024-08-23 DIAGNOSIS — M542 Cervicalgia: Secondary | ICD-10-CM | POA: Diagnosis not present

## 2024-08-23 DIAGNOSIS — S161XXA Strain of muscle, fascia and tendon at neck level, initial encounter: Secondary | ICD-10-CM | POA: Diagnosis not present

## 2024-08-23 DIAGNOSIS — R519 Headache, unspecified: Secondary | ICD-10-CM | POA: Insufficient documentation

## 2024-08-23 DIAGNOSIS — R109 Unspecified abdominal pain: Secondary | ICD-10-CM | POA: Diagnosis not present

## 2024-08-23 DIAGNOSIS — Y92410 Unspecified street and highway as the place of occurrence of the external cause: Secondary | ICD-10-CM | POA: Diagnosis not present

## 2024-08-23 DIAGNOSIS — S29012A Strain of muscle and tendon of back wall of thorax, initial encounter: Secondary | ICD-10-CM | POA: Diagnosis not present

## 2024-08-23 DIAGNOSIS — M25569 Pain in unspecified knee: Secondary | ICD-10-CM | POA: Diagnosis not present

## 2024-08-23 DIAGNOSIS — M545 Low back pain, unspecified: Secondary | ICD-10-CM | POA: Diagnosis not present

## 2024-08-23 LAB — CBC WITH DIFFERENTIAL/PLATELET
Abs Immature Granulocytes: 0.01 K/uL (ref 0.00–0.07)
Basophils Absolute: 0 K/uL (ref 0.0–0.1)
Basophils Relative: 1 %
Eosinophils Absolute: 0 K/uL (ref 0.0–0.5)
Eosinophils Relative: 1 %
HCT: 41.3 % (ref 36.0–46.0)
Hemoglobin: 13.7 g/dL (ref 12.0–15.0)
Immature Granulocytes: 0 %
Lymphocytes Relative: 23 %
Lymphs Abs: 1.2 K/uL (ref 0.7–4.0)
MCH: 28.6 pg (ref 26.0–34.0)
MCHC: 33.2 g/dL (ref 30.0–36.0)
MCV: 86.2 fL (ref 80.0–100.0)
Monocytes Absolute: 0.4 K/uL (ref 0.1–1.0)
Monocytes Relative: 7 %
Neutro Abs: 3.4 K/uL (ref 1.7–7.7)
Neutrophils Relative %: 68 %
Platelets: 212 K/uL (ref 150–400)
RBC: 4.79 MIL/uL (ref 3.87–5.11)
RDW: 13.9 % (ref 11.5–15.5)
WBC: 5 K/uL (ref 4.0–10.5)
nRBC: 0 % (ref 0.0–0.2)

## 2024-08-23 LAB — COMPREHENSIVE METABOLIC PANEL WITH GFR
ALT: 28 U/L (ref 0–44)
AST: 24 U/L (ref 15–41)
Albumin: 4.5 g/dL (ref 3.5–5.0)
Alkaline Phosphatase: 69 U/L (ref 38–126)
Anion gap: 10 (ref 5–15)
BUN: 17 mg/dL (ref 6–20)
CO2: 24 mmol/L (ref 22–32)
Calcium: 9.3 mg/dL (ref 8.9–10.3)
Chloride: 103 mmol/L (ref 98–111)
Creatinine, Ser: 0.78 mg/dL (ref 0.44–1.00)
GFR, Estimated: >60 mL/min (ref >60)
Glucose, Bld: 111 mg/dL — ABNORMAL HIGH (ref 70–99)
Potassium: 4 mmol/L (ref 3.5–5.1)
Sodium: 137 mmol/L (ref 135–145)
Total Bilirubin: 0.4 mg/dL (ref 0.0–1.2)
Total Protein: 7.8 g/dL (ref 6.5–8.1)

## 2024-08-23 LAB — HCG, QUANTITATIVE, PREGNANCY: hCG, Beta Chain, Quant, S: <1 m[IU]/mL (ref <5)

## 2024-08-23 MED ORDER — TRAMADOL HCL 50 MG PO TABS: ORAL_TABLET | ORAL | 0 refills | Status: AC

## 2024-08-23 MED ORDER — SODIUM CHLORIDE 0.9 % IV BOLUS
500.0000 mL | Freq: Once | INTRAVENOUS | Status: AC
  Administered 2024-08-23: 500 mL via INTRAVENOUS

## 2024-08-23 MED ORDER — ONDANSETRON HCL 4 MG/2ML IJ SOLN
4.0000 mg | Freq: Once | INTRAMUSCULAR | Status: AC
  Administered 2024-08-23: 4 mg via INTRAVENOUS
  Filled 2024-08-23: qty 2

## 2024-08-23 MED ORDER — HYDROMORPHONE HCL 1 MG/ML IJ SOLN
0.5000 mg | Freq: Once | INTRAMUSCULAR | Status: AC
  Administered 2024-08-23: 0.5 mg via INTRAVENOUS
  Filled 2024-08-23: qty 0.5

## 2024-08-23 MED ORDER — IOHEXOL 300 MG/ML  SOLN
100.0000 mL | Freq: Once | INTRAMUSCULAR | Status: AC | PRN
  Administered 2024-08-23: 100 mL via INTRAVENOUS
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
## 2024-08-23 NOTE — ED Notes (Signed)
 Pt in bed, pt states that her pain is a 7/10, states that she is ready to go home, c collar has been removed by md, read and reviewed d/c instructions and follow up, advised to return for any concerns or worsening symptoms. Pt ambulatory from department with steady gait.
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
## 2024-08-23 NOTE — Discharge Instructions (Signed)
Follow-up with your family doctor if any problems 

## 2024-09-06 ENCOUNTER — Encounter: Payer: Self-pay | Admitting: Radiology

## 2024-09-08 ENCOUNTER — Encounter: Admitting: Orthopedic Surgery

## 2024-09-21 ENCOUNTER — Ambulatory Visit: Admitting: Orthopedic Surgery

## 2024-09-21 ENCOUNTER — Other Ambulatory Visit (INDEPENDENT_AMBULATORY_CARE_PROVIDER_SITE_OTHER): Payer: Self-pay

## 2024-09-21 ENCOUNTER — Encounter: Payer: Self-pay | Admitting: Orthopedic Surgery

## 2024-09-21 VITALS — BP 127/82 | HR 87 | Ht 65.0 in | Wt 158.0 lb

## 2024-09-21 DIAGNOSIS — M25561 Pain in right knee: Secondary | ICD-10-CM | POA: Diagnosis not present

## 2024-09-21 DIAGNOSIS — M25532 Pain in left wrist: Secondary | ICD-10-CM | POA: Diagnosis not present

## 2024-09-21 DIAGNOSIS — S66912A Strain of unspecified muscle, fascia and tendon at wrist and hand level, left hand, initial encounter: Secondary | ICD-10-CM | POA: Diagnosis not present

## 2024-09-21 MED ORDER — IBUPROFEN 800 MG PO TABS
800.0000 mg | ORAL_TABLET | Freq: Three times a day (TID) | ORAL | 0 refills | Status: AC | PRN
Start: 2024-09-21 — End: ?
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
## 2024-10-09 DIAGNOSIS — J039 Acute tonsillitis, unspecified: Secondary | ICD-10-CM | POA: Diagnosis not present
